# Patient Record
Sex: Female | Born: 1990 | Race: Black or African American | Hispanic: No | Marital: Single | Smoking: Former smoker
Health system: Southern US, Community
[De-identification: ages and names within clinical notes are randomized; demographics above are authoritative.]

## PROBLEM LIST (undated history)

## (undated) DIAGNOSIS — J45909 Unspecified asthma, uncomplicated: Secondary | ICD-10-CM

## (undated) DIAGNOSIS — T7840XA Allergy, unspecified, initial encounter: Secondary | ICD-10-CM

## (undated) HISTORY — DX: Allergy, unspecified, initial encounter: T78.40XA

## (undated) HISTORY — DX: Unspecified asthma, uncomplicated: J45.909

---
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
## 2009-09-27 ENCOUNTER — Emergency Department: Payer: Self-pay | Admitting: Emergency Medicine

## 2010-10-03 ENCOUNTER — Ambulatory Visit: Payer: Self-pay | Admitting: Family Medicine

## 2011-01-27 ENCOUNTER — Emergency Department (HOSPITAL_COMMUNITY)
Admission: EM | Admit: 2011-01-27 | Discharge: 2011-01-27 | Disposition: A | Discharge status: Home / Self Care | Payer: Self-pay | Attending: Emergency Medicine | Admitting: Emergency Medicine

## 2011-01-27 ENCOUNTER — Encounter (HOSPITAL_COMMUNITY): Payer: Self-pay | Admitting: Emergency Medicine

## 2011-01-27 DIAGNOSIS — J111 Influenza due to unidentified influenza virus with other respiratory manifestations: Secondary | ICD-10-CM | POA: Insufficient documentation

## 2011-01-27 DIAGNOSIS — R6889 Other general symptoms and signs: Secondary | ICD-10-CM

## 2011-01-27 MED ORDER — HYDROCODONE-HOMATROPINE 5-1.5 MG/5ML PO SYRP: 5.0000 mL | ORAL_SOLUTION | Freq: Four times a day (QID) | ORAL | Status: DC | PRN

## 2011-01-27 NOTE — ED Notes (Signed)
Pt reports three day hx of body aches, cough, nausea.Last full meal-16 hrs ago-pizza

## 2011-01-27 NOTE — ED Provider Notes (Signed)
History     CSN: [id number]  Arrival date & time 01/27/11  4098   First MD Initiated Contact with Patient 01/27/11 1002      Chief Complaint  Patient presents with  . Cough  . Nausea  . Dizziness  . Generalized Body Aches  . Facial Pain    (Consider location/radiation/quality/duration/timing/severity/associated sxs/prior treatment) HPI Comments: Pt presents to the ED with complaints of flu-like symptoms of cough, congestion, sore throat, muscle aches, chills,& fevers. The patient states that the symptoms started Thursday, 4 days ago.  Pt has been around other sick contacts and did not get the flu shot this year. The patient denies ear pain,  vomiting, diarrhea headaches, neck pain, weakness, vision changes, severe abdominal pain, inability to eat or drink, difficulty breathing, SOB, wheezing, chest pain. The patient has tried cough medicine, NSAIDS, and rest but has only felt mild relief.    Patient is a 21 y.o. female presenting with cough. The history is provided by the patient.  Cough Associated symptoms include chills and myalgias. Pertinent negatives include no chest pain, no ear pain, no rhinorrhea and no sore throat.    History reviewed. No pertinent past medical history.  History reviewed. No pertinent past surgical history.  Family History  Problem Relation Age of Onset  . Diabetes Mother   . Hypertension Mother   . Asthma Mother     History  Substance Use Topics  . Smoking status: Current Everyday Smoker  . Smokeless tobacco: Not on file  . Alcohol Use: Yes    OB History    Grav Para Term Preterm Abortions TAB SAB Ect Mult Living                  Review of Systems  Constitutional: Positive for fever, chills and fatigue.  HENT: Positive for congestion. Negative for ear pain, sore throat, rhinorrhea, sneezing, neck pain, neck stiffness, sinus pressure and tinnitus.   Eyes: Negative for visual disturbance.  Respiratory: Positive for cough and chest  tightness.   Cardiovascular: Negative for chest pain and palpitations.  Gastrointestinal: Negative for nausea, vomiting, abdominal pain and diarrhea.  Genitourinary: Negative for dysuria.  Musculoskeletal: Positive for myalgias.  Skin: Negative for color change and rash.  Neurological: Negative for dizziness and weakness.  Hematological: Does not bruise/bleed easily.  Psychiatric/Behavioral: Negative for confusion.  All other systems reviewed and are negative.    Allergies  Review of patient's allergies indicates no known allergies.  Home Medications   Current Outpatient Rx  Name Route Sig Dispense Refill  . DAYQUIL PO Oral Take 1 capsule by mouth every 8 (eight) hours as needed. Cold symptoms      BP 101/61  Pulse 64  Temp(Src) 98.6 F (37 C) (Oral)  Resp 12  SpO2 100%  LMP 01/04/2011  Physical Exam  Constitutional: She is oriented to person, place, and time. She appears well-developed and well-nourished. No distress.  HENT:  Head: Normocephalic and atraumatic. No trismus in the jaw.  Right Ear: External ear normal. No drainage or tenderness. No mastoid tenderness.  Left Ear: External ear normal. No drainage or tenderness. No mastoid tenderness.  Nose: Nose normal. No rhinorrhea or sinus tenderness.  Mouth/Throat: Uvula is midline, oropharynx is clear and moist and mucous membranes are normal. No uvula swelling. No oropharyngeal exudate.  Eyes: Conjunctivae and EOM are normal. Right eye exhibits no discharge. Left eye exhibits no discharge. No scleral icterus.  Neck: Normal range of motion. Neck supple.  Cardiovascular: Normal  rate, regular rhythm and normal heart sounds.   Pulmonary/Chest: Effort normal and breath sounds normal. No stridor. No respiratory distress. She has no wheezes. She exhibits tenderness.  Abdominal: Soft. There is no tenderness.  Musculoskeletal: Normal range of motion.  Neurological: She is alert and oriented to person, place, and time.  Skin:  Skin is warm and dry. No rash noted. She is not diaphoretic.  Psychiatric: She has a normal mood and affect. Her behavior is normal.    ED Course  Procedures (including critical care time)  Labs Reviewed - No data to display No results found.   No diagnosis found.    MDM  Flu like s/s Patient with symptoms consistent with influenza.  Vitals are stable, low-grade fever.  No signs of dehydration, tolerating PO's.  Lungs are clear. Due to patient's presentation and physical exam a chest x-ray was not ordered bc likely diagnosis of flu.  Discussed the cost versus benefit of Tamiflu treatment with the patient.  The patient understands that symptoms are greater than the recommended 24-48 hour window of treatment.  Patient will be discharged with instructions to orally hydrate, rest, and use over-the-counter medications such as anti-inflammatories ibuprofen and Aleve for muscle aches and Tylenol for fever.  Patient will also be given a cough suppressant.         Jaci Carrel, [place] 01/27/11 1040

## 2011-01-28 NOTE — ED Provider Notes (Signed)
Medical screening examination/treatment/procedure(s) were performed by non-physician practitioner and as supervising physician I was immediately available for consultation/collaboration.  Felecity Lemaster, MD 01/28/11 0718 

## 2011-02-01 ENCOUNTER — Emergency Department (HOSPITAL_COMMUNITY)
Admission: EM | Admit: 2011-02-01 | Discharge: 2011-02-01 | Disposition: A | Discharge status: Home / Self Care | Payer: BC Managed Care – PPO | Attending: Emergency Medicine | Admitting: Emergency Medicine

## 2011-02-01 ENCOUNTER — Other Ambulatory Visit: Payer: Self-pay

## 2011-02-01 ENCOUNTER — Encounter (HOSPITAL_COMMUNITY): Payer: Self-pay | Admitting: *Deleted

## 2011-02-01 DIAGNOSIS — R112 Nausea with vomiting, unspecified: Secondary | ICD-10-CM | POA: Insufficient documentation

## 2011-02-01 DIAGNOSIS — R55 Syncope and collapse: Secondary | ICD-10-CM | POA: Insufficient documentation

## 2011-02-01 DIAGNOSIS — J111 Influenza due to unidentified influenza virus with other respiratory manifestations: Secondary | ICD-10-CM | POA: Insufficient documentation

## 2011-02-01 LAB — BASIC METABOLIC PANEL
BUN: 15 mg/dL (ref 6–23)
GFR calc Af Amer: >90 mL/min (ref >90)
GFR calc non Af Amer: >90 mL/min (ref >90)
Potassium: 3.8 mEq/L (ref 3.5–5.1)
Sodium: 140 mEq/L (ref 135–145)

## 2011-02-01 LAB — DIFFERENTIAL
Basophils Relative: 0 % (ref 0–1)
Eosinophils Absolute: 0.1 10*3/uL (ref 0.0–0.7)
Lymphocytes Relative: 24 % (ref 12–46)
Monocytes Absolute: 0.3 10*3/uL (ref 0.1–1.0)
Neutro Abs: 3.6 10*3/uL (ref 1.7–7.7)

## 2011-02-01 LAB — CBC
MCHC: 34.3 g/dL (ref 30.0–36.0)
Platelets: 282 10*3/uL (ref 150–400)
RDW: 13.3 % (ref 11.5–15.5)

## 2011-02-01 MED ORDER — ONDANSETRON HCL 4 MG/2ML IJ SOLN
4.0000 mg | Freq: Once | INTRAMUSCULAR | Status: AC
  Administered 2011-02-01: 4 mg via INTRAVENOUS
  Filled 2011-02-01: qty 2

## 2011-02-01 MED ORDER — KETOROLAC TROMETHAMINE 30 MG/ML IJ SOLN
30.0000 mg | Freq: Once | INTRAMUSCULAR | Status: AC
  Administered 2011-02-01: 30 mg via INTRAVENOUS
  Filled 2011-02-01: qty 1

## 2011-02-01 MED ORDER — SODIUM CHLORIDE 0.9 % IV BOLUS (SEPSIS)
1000.0000 mL | Freq: Once | INTRAVENOUS | Status: AC
  Administered 2011-02-01: 1000 mL via INTRAVENOUS

## 2011-02-01 MED ORDER — ONDANSETRON HCL 4 MG PO TABS: 4.0000 mg | ORAL_TABLET | Freq: Three times a day (TID) | ORAL | Status: AC | PRN

## 2011-02-01 NOTE — ED Provider Notes (Addendum)
History     CSN: [id number]  Arrival date & time 02/01/11  4098   First MD Initiated Contact with Patient 02/01/11 [phone]      Chief Complaint  Patient presents with  . Generalized Body Aches  . Nausea  . Dizziness  . Headache  . Chills    hot flashes    (Consider location/radiation/quality/duration/timing/severity/associated sxs/prior treatment) HPI The patient presents 5 days after an initial presentation for this illness.  Her symptoms actually began 8 days ago.  She was diagnosed 5 days ago with influenza, clinically.  She notes that in the interim she has been anorexic, with persistent myalgias, subjective fever, cough.  She now notes new near-syncope, worsening aches, worsening nausea.  The aches are diffuse.  No relief w OTC meds or narcotics.  No exacerbating factors. History reviewed. No pertinent past medical history.  History reviewed. No pertinent past surgical history.  Family History  Problem Relation Age of Onset  . Diabetes Mother   . Hypertension Mother   . Asthma Mother     History  Substance Use Topics  . Smoking status: Current Everyday Smoker  . Smokeless tobacco: Not on file  . Alcohol Use: Yes    OB History    Grav Para Term Preterm Abortions TAB SAB Ect Mult Living                  Review of Systems  Constitutional:       HPI  HENT:       HPI otherwise negative  Eyes: Negative.   Respiratory:       HPI, otherwise negative  Cardiovascular:       HPI, otherwise nmegative  Gastrointestinal: Positive for nausea and vomiting. Negative for diarrhea.  Genitourinary:       HPI, otherwise negative  Musculoskeletal:       HPI, otherwise negative  Skin: Negative.   Neurological: Negative for syncope.    Allergies  Review of patient's allergies indicates no known allergies.  Home Medications   Current Outpatient Rx  Name Route Sig Dispense Refill  . HYDROCODONE-HOMATROPINE 5-1.5 MG/5ML PO SYRP Oral Take 5 mLs by mouth every 6 (six)  hours as needed. For cough or pain    . DAYQUIL PO Oral Take 1 capsule by mouth every 8 (eight) hours as needed. Cold symptoms      BP 122/55  Pulse 72  Temp(Src) 98.3 F (36.8 C) (Oral)  Resp 19  SpO2 100%  LMP 01/04/2011  Physical Exam  Nursing note and vitals reviewed. Constitutional: She is oriented to person, place, and time. She appears well-developed and well-nourished. No distress.  HENT:  Head: Normocephalic and atraumatic.  Eyes: Conjunctivae and EOM are normal.  Cardiovascular: Normal rate and regular rhythm.   Pulmonary/Chest: Effort normal. No accessory muscle usage or stridor. Not tachypneic. No respiratory distress. She has no decreased breath sounds. She has no wheezes. She has no rhonchi. She has no rales.  Abdominal: She exhibits no distension.  Musculoskeletal: She exhibits no edema.  Neurological: She is alert and oriented to person, place, and time. No cranial nerve deficit.  Skin: Skin is warm and dry.  Psychiatric: She has a normal mood and affect.    ED Course  Procedures (including critical care time)   Labs Reviewed  CBC  BASIC METABOLIC PANEL  DIFFERENTIAL   No results found.   No diagnosis found.    Date: 02/01/2011  Rate: 57  Rhythm: normal sinus rhythm  QRS  Axis: normal  Intervals: normal  ST/T Wave abnormalities: normal  Conduction Disutrbances:none  Narrative Interpretation:   Old EKG Reviewed: none available NORMAL  MDM  This generally well female now presents one week after her symptoms began, and several days after being clinically diagnosed with influenza.  On exam the patient is in no distress, with unremarkable vital signs.  The patient notes that she continues to tolerate by mouth, though she does have anorexia.  The absence of fevers, chills, disorientation, abnormal vital signs, remarkable labs are reassuring.  The patient is likely experience the continued effects of her flu-like illness.  She was d/c in stable condition  w anti-emetics, PMD F/U.        Gerhard Munch, MD 02/01/11 1120  Gerhard Munch, MD 02/01/11 1123

## 2011-02-01 NOTE — ED Notes (Signed)
Patient here Monday given hydrocodone. Continues with symptoms. Although she has felt dizzy she has not had any syncopal events.

## 2011-07-04 ENCOUNTER — Ambulatory Visit: Payer: BC Managed Care – PPO

## 2011-07-09 ENCOUNTER — Emergency Department (HOSPITAL_COMMUNITY): Payer: BC Managed Care – PPO

## 2011-07-09 ENCOUNTER — Encounter (HOSPITAL_COMMUNITY): Payer: Self-pay | Admitting: Emergency Medicine

## 2011-07-09 ENCOUNTER — Emergency Department (HOSPITAL_COMMUNITY)
Admission: EM | Admit: 2011-07-09 | Discharge: 2011-07-09 | Disposition: A | Discharge status: Home / Self Care | Payer: BC Managed Care – PPO | Attending: Emergency Medicine | Admitting: Emergency Medicine

## 2011-07-09 DIAGNOSIS — M7989 Other specified soft tissue disorders: Secondary | ICD-10-CM | POA: Insufficient documentation

## 2011-07-09 DIAGNOSIS — M6789 Other specified disorders of synovium and tendon, multiple sites: Secondary | ICD-10-CM

## 2011-07-09 DIAGNOSIS — M79609 Pain in unspecified limb: Secondary | ICD-10-CM | POA: Insufficient documentation

## 2011-07-09 DIAGNOSIS — W219XXA Striking against or struck by unspecified sports equipment, initial encounter: Secondary | ICD-10-CM | POA: Insufficient documentation

## 2011-07-09 DIAGNOSIS — F17200 Nicotine dependence, unspecified, uncomplicated: Secondary | ICD-10-CM | POA: Insufficient documentation

## 2011-07-09 MED ORDER — HYDROCODONE-ACETAMINOPHEN 5-325 MG PO TABS: 1.0000 | ORAL_TABLET | ORAL | Status: AC | PRN

## 2011-07-09 NOTE — ED Provider Notes (Signed)
Medical screening examination/treatment/procedure(s) were performed by non-physician practitioner and as supervising physician I was immediately available for consultation/collaboration.    Vida Roller, MD 07/09/11 [phone]

## 2011-07-09 NOTE — ED Provider Notes (Signed)
History     CSN: [id number]  Arrival date & time 07/09/11  1454   First MD Initiated Contact with Patient 07/09/11 1545      No chief complaint on file.   (Consider location/radiation/quality/duration/timing/severity/associated sxs/prior treatment) HPI Comments: Patient, who is left handed, presents today with left 5th finger pain and swelling at the PIP joint after jamming the finger while playing basketball two weeks ago - she states that she initially splinted it on her own, but kept taking the splint off to check the finger.  She states that it continues to hurt and swell - she denies numbness or tingling, she is able to fully flex the finger but is unable to completely extend it.  Patient is a 21 y.o. female presenting with hand pain. The history is provided by the patient. No language interpreter was used.  Hand Pain This is a new problem. The current episode started 1 to 4 weeks ago. The problem occurs constantly. The problem has been unchanged. Associated symptoms include arthralgias and joint swelling. Pertinent negatives include no abdominal pain, anorexia, change in bowel habit, chest pain, chills, congestion, coughing, diaphoresis, fatigue, fever, headaches, myalgias, nausea, neck pain, numbness, rash, sore throat, swollen glands, urinary symptoms, vertigo, visual change, vomiting or weakness. The symptoms are aggravated by bending. She has tried nothing for the symptoms. The treatment provided no relief.    History reviewed. No pertinent past medical history.  History reviewed. No pertinent past surgical history.  Family History  Problem Relation Age of Onset  . Diabetes Mother   . Hypertension Mother   . Asthma Mother     History  Substance Use Topics  . Smoking status: Current Everyday Smoker  . Smokeless tobacco: Not on file  . Alcohol Use: Yes    OB History    Grav Para Term Preterm Abortions TAB SAB Ect Mult Living                  Review of Systems    Constitutional: Negative for fever, chills, diaphoresis and fatigue.  HENT: Negative for congestion, sore throat and neck pain.   Respiratory: Negative for cough.   Cardiovascular: Negative for chest pain.  Gastrointestinal: Negative for nausea, vomiting, abdominal pain, anorexia and change in bowel habit.  Musculoskeletal: Positive for joint swelling and arthralgias. Negative for myalgias.  Skin: Negative for rash.  Neurological: Negative for vertigo, weakness, numbness and headaches.  All other systems reviewed and are negative.    Allergies  Review of patient's allergies indicates no known allergies.  Home Medications  No current outpatient prescriptions on file.  BP 102/61  Pulse 62  Temp 98.8 F (37.1 C) (Oral)  Resp 16  SpO2 100%  LMP 06/17/2011  Physical Exam  Nursing note and vitals reviewed. Constitutional: She is oriented to person, place, and time. She appears well-developed and well-nourished. No distress.  HENT:  Head: Normocephalic and atraumatic.  Right Ear: External ear normal.  Left Ear: External ear normal.  Nose: Nose normal.  Mouth/Throat: Oropharynx is clear and moist. No oropharyngeal exudate.  Eyes: Conjunctivae are normal. Pupils are equal, round, and reactive to light. No scleral icterus.  Neck: Normal range of motion. Neck supple.  Cardiovascular: Normal rate, regular rhythm and normal heart sounds.  Exam reveals no gallop and no friction rub.   No murmur heard. Pulmonary/Chest: Effort normal and breath sounds normal. No respiratory distress. She has no wheezes. She has no rales. She exhibits no tenderness.  Abdominal: Soft. Bowel  sounds are normal. She exhibits no distension. There is no tenderness.  Musculoskeletal:       Left hand: She exhibits decreased range of motion, tenderness, bony tenderness and swelling. She exhibits normal two-point discrimination and normal capillary refill. normal sensation noted. Normal strength noted.        Swelling noted at left 5th PIP, flexion intact, unable to fully extend the finger, sensation and capillary refill normal.  Lymphadenopathy:    She has no cervical adenopathy.  Neurological: She is alert and oriented to person, place, and time. No cranial nerve deficit. She exhibits normal muscle tone. Coordination normal.  Skin: Skin is warm and dry. No rash noted. No erythema. No pallor.  Psychiatric: She has a normal mood and affect. Her behavior is normal. Judgment and thought content normal.    ED Course  Procedures (including critical care time)  Labs Reviewed - No data to display Dg Hand Complete Left  07/09/2011  *RADIOLOGY REPORT*  Clinical Data: Blunt trauma to the left fifth finger playing basketball 2 weeks ago.  Persistent left fifth finger pain.  LEFT HAND - COMPLETE 3+ VIEW  Comparison: No priors.  Findings: Three views of the hand demonstrate no acute fracture, subluxation, dislocation or joint abnormality.  Specifically, the left fifth finger appears intact. However, there is soft tissue swelling around the PIP joint in the left fifth finger.  IMPRESSION: 1.  Negative for acute bony abnormality. 2.  However, there is persistent soft tissue swelling around the PIP joint of the left fifth finger, particularly laterally.  Original Report Authenticated By: Florencia Reasons, M.D.     Left 5th PIP extensor tendon disruption.   MDM  Patient here with pain and swelling at DIP and inability to fully straighten the finger at the PIP joint so I suspect avulsion of the extensor tendon there.  I have splinted the finger in extension and will refer to Dr. Amanda Pea with hand surgery, she has been informed not to remove the splint until she is seen by Dr. Amanda Pea.        Izola Price [place], [place] 07/09/11 1717

## 2011-07-09 NOTE — ED Notes (Addendum)
Pt reports hurting left fithh finger while playing basketball. Deformity noted and swelling.

## 2011-11-10 ENCOUNTER — Other Ambulatory Visit: Payer: Self-pay

## 2011-11-17 ENCOUNTER — Emergency Department (HOSPITAL_COMMUNITY)
Admission: EM | Admit: 2011-11-17 | Discharge: 2011-11-17 | Disposition: A | Discharge status: Home / Self Care | Payer: BC Managed Care – PPO | Attending: Emergency Medicine | Admitting: Emergency Medicine

## 2011-11-17 ENCOUNTER — Encounter (HOSPITAL_COMMUNITY): Payer: Self-pay | Admitting: *Deleted

## 2011-11-17 DIAGNOSIS — F17200 Nicotine dependence, unspecified, uncomplicated: Secondary | ICD-10-CM | POA: Insufficient documentation

## 2011-11-17 DIAGNOSIS — H16009 Unspecified corneal ulcer, unspecified eye: Secondary | ICD-10-CM | POA: Insufficient documentation

## 2011-11-17 MED ORDER — TETRACAINE HCL 0.5 % OP SOLN
OPHTHALMIC | Status: AC
  Filled 2011-11-17: qty 2

## 2011-11-17 MED ORDER — TOBRAMYCIN 0.3 % OP SOLN: 1.0000 [drp] | OPHTHALMIC | Status: DC

## 2011-11-17 NOTE — ED Notes (Signed)
Pt c/o right eye irritation x's 2 days. Pt reports eye is watery.

## 2011-11-17 NOTE — ED Provider Notes (Signed)
History     CSN: [id number]  Arrival date & time 11/17/11  1407   First MD Initiated Contact with Patient 11/17/11 1415      Chief Complaint  Patient presents with  . Eye Pain    (Consider location/radiation/quality/duration/timing/severity/associated sxs/prior treatment) HPI Comments: Patient is a 21 year old female who presents with a 2 day history of right eye pain. The patient reports a severe, "scratching" sensation in her right eye. The pain started gradually and progressively worsened since the onset. The pain is localized and does not radiate. No aggravating/alleviating factors. Patient has not tried anything for symptom relief. Patient reports associated watery discharge and photophobia. Patient wears contacts usually and has not been able to wear contacts for the past 2 days due to pain and irritation. Patient denies fever/chills, headache, congestion, sore throat, ear pain.    History reviewed. No pertinent past medical history.  History reviewed. No pertinent past surgical history.  Family History  Problem Relation Age of Onset  . Diabetes Mother   . Hypertension Mother   . Asthma Mother     History  Substance Use Topics  . Smoking status: Current Every Day Smoker    Types: Cigarettes  . Smokeless tobacco: Not on file  . Alcohol Use: Yes    OB History    Grav Para Term Preterm Abortions TAB SAB Ect Mult Living                  Review of Systems  Eyes: Positive for photophobia, pain, discharge, redness and visual disturbance.  All other systems reviewed and are negative.    Allergies  Review of patient's allergies indicates no known allergies.  Home Medications  No current outpatient prescriptions on file.  BP 117/66  Pulse 73  Temp 98.5 F (36.9 C) (Oral)  Resp 18  Ht 5\' 6"  (1.676 m)  Wt 148 lb 6.4 oz (67.314 kg)  BMI 23.95 kg/m2  SpO2 99%  LMP 10/17/2011  Physical Exam  Nursing note and vitals reviewed. Constitutional: She is oriented  to person, place, and time. She appears well-developed and well-nourished. No distress.  HENT:  Head: Normocephalic and atraumatic.  Eyes: EOM are normal. Pupils are equal, round, and reactive to light. No scleral icterus.       Watery discharge from right eye. Right sclera injected. Small (pinpoint) area of developing corneal ulceration located at the 6 oclock position around the pupil. Area of ulceration does not overlie the pupil or affected visual acuity.   Neck: Normal range of motion.  Cardiovascular: Normal rate and regular rhythm.  Exam reveals no gallop and no friction rub.   No murmur heard. Pulmonary/Chest: Effort normal and breath sounds normal. She has no wheezes. She has no rales. She exhibits no tenderness.  Abdominal: Soft. She exhibits no distension.  Musculoskeletal: Normal range of motion.  Neurological: She is alert and oriented to person, place, and time.       Speech is goal-oriented. Moves limbs without ataxia.   Skin: Skin is warm and dry.  Psychiatric: She has a normal mood and affect. Her behavior is normal.    ED Course  Procedures (including critical care time)  Labs Reviewed - No data to display No results found.   1. Corneal ulceration       MDM  3:17 PM Patient's eye exam reveals developing corneal ulceration. Patient will be discharged with Tobramycin eye drops and instructions for close follow up with her ophthalmologist. No further  evaluation needed at this time. Patient instructed to return with worsening or concerning symptoms.         Emilia Beck, PA-C 11/17/11 1533

## 2011-11-18 NOTE — ED Provider Notes (Signed)
Medical screening examination/treatment/procedure(s) were conducted as a shared visit with non-physician practitioner(s) and myself.  I personally evaluated the patient during the encounter  Appears to be early corneal ulceration. optho follow up in 24 hours. Tobramycin drops. She understands the importance of seeing the opthomologist  Lyanne Co, MD 11/18/11 1556

## 2013-05-04 ENCOUNTER — Other Ambulatory Visit: Payer: Self-pay | Admitting: Neurological Surgery

## 2013-05-12 ENCOUNTER — Encounter (HOSPITAL_COMMUNITY): Payer: Self-pay | Admitting: Pharmacy Technician

## 2013-05-12 ENCOUNTER — Inpatient Hospital Stay (HOSPITAL_COMMUNITY)
Admission: RE | Admit: 2013-05-12 | Discharge: 2013-05-12 | Disposition: A | Discharge status: Home / Self Care | Payer: BC Managed Care – PPO | Source: Ambulatory Visit

## 2013-05-12 NOTE — Pre-Procedure Instructions (Signed)
Sierra GallusBrittany Long  05/12/2013   Your procedure is scheduled on:  Monday, May 11th   [facility] Admitting at  5:30  AM.   Call this number if you have problems the morning of surgery: [phone]   Remember:   Do not eat food or drink liquids after midnight Sunday.   Take these medicines the morning of surgery with A SIP OF WATER:  Tobrex eye drops   Do not wear jewelry, make-up or nail polish.  Do not wear lotions, powders, or perfumes. You may NOT  wear deodorant.  Do not shave underarms & legs 48 hours prior to surgery.    Do not bring valuables to the hospital.  [facility] is not responsible for any belongings or valuables.               Contacts, dentures or bridgework may not be worn into surgery.  Leave suitcase in the car. After surgery it may be brought to your room.  For patients admitted to the hospital, discharge time is determined by your treatment team.               Patients discharged the day of surgery will not be allowed to drive home.   Name and phone number of your driver:    Special Instructions: "Preparing for Surgery" instruction sheet.   Please read over the following fact sheets that you were given: Pain Booklet and Surgical Site Infection Prevention

## 2013-05-13 ENCOUNTER — Encounter (HOSPITAL_COMMUNITY): Payer: Self-pay

## 2013-05-13 ENCOUNTER — Encounter (HOSPITAL_COMMUNITY)
Admission: RE | Admit: 2013-05-13 | Discharge: 2013-05-13 | Disposition: A | Discharge status: Home / Self Care | Payer: Managed Care, Other (non HMO) | Source: Ambulatory Visit | Attending: Neurological Surgery | Admitting: Neurological Surgery

## 2013-05-13 DIAGNOSIS — F17200 Nicotine dependence, unspecified, uncomplicated: Secondary | ICD-10-CM | POA: Diagnosis not present

## 2013-05-13 DIAGNOSIS — M47812 Spondylosis without myelopathy or radiculopathy, cervical region: Secondary | ICD-10-CM | POA: Diagnosis present

## 2013-05-13 DIAGNOSIS — Z01812 Encounter for preprocedural laboratory examination: Secondary | ICD-10-CM | POA: Diagnosis not present

## 2013-05-13 DIAGNOSIS — S13171A Dislocation of C6/C7 cervical vertebrae, initial encounter: Secondary | ICD-10-CM | POA: Diagnosis not present

## 2013-05-13 DIAGNOSIS — S129XXA Fracture of neck, unspecified, initial encounter: Secondary | ICD-10-CM | POA: Diagnosis not present

## 2013-05-13 LAB — CBC
HCT: 36.6 % (ref 36.0–46.0)
Hemoglobin: 12.2 g/dL (ref 12.0–15.0)
MCH: 28.9 pg (ref 26.0–34.0)
MCHC: 33.3 g/dL (ref 30.0–36.0)
MCV: 86.7 fL (ref 78.0–100.0)
PLATELETS: 268 10*3/uL (ref 150–400)
RBC: 4.22 MIL/uL (ref 3.87–5.11)
RDW: 13.2 % (ref 11.5–15.5)
WBC: 3.5 10*3/uL — ABNORMAL LOW (ref 4.0–10.5)

## 2013-05-13 LAB — HCG, SERUM, QUALITATIVE: PREG SERUM: NEGATIVE

## 2013-05-13 LAB — SURGICAL PCR SCREEN
MRSA, PCR: NEGATIVE
Staphylococcus aureus: NEGATIVE

## 2013-05-15 MED ORDER — CEFAZOLIN SODIUM-DEXTROSE 2-3 GM-% IV SOLR
2.0000 g | INTRAVENOUS | Status: AC
  Administered 2013-05-16: 2 g via INTRAVENOUS
  Filled 2013-05-15: qty 50

## 2013-05-16 ENCOUNTER — Ambulatory Visit (HOSPITAL_COMMUNITY)
Admission: RE | Admit: 2013-05-16 | Discharge: 2013-05-16 | Disposition: A | Discharge status: Home / Self Care | Payer: Managed Care, Other (non HMO) | Source: Ambulatory Visit | Attending: Neurological Surgery | Admitting: Neurological Surgery

## 2013-05-16 ENCOUNTER — Encounter (HOSPITAL_COMMUNITY): Payer: Managed Care, Other (non HMO) | Admitting: Anesthesiology

## 2013-05-16 ENCOUNTER — Encounter (HOSPITAL_COMMUNITY): Payer: Self-pay | Admitting: *Deleted

## 2013-05-16 ENCOUNTER — Ambulatory Visit (HOSPITAL_COMMUNITY): Payer: Managed Care, Other (non HMO)

## 2013-05-16 ENCOUNTER — Ambulatory Visit (HOSPITAL_COMMUNITY): Payer: Managed Care, Other (non HMO) | Admitting: Anesthesiology

## 2013-05-16 ENCOUNTER — Encounter (HOSPITAL_COMMUNITY)
Admission: RE | Disposition: A | Discharge status: Home / Self Care | Payer: Self-pay | Source: Ambulatory Visit | Attending: Neurological Surgery

## 2013-05-16 DIAGNOSIS — M50223 Other cervical disc displacement at C6-C7 level: Secondary | ICD-10-CM | POA: Diagnosis present

## 2013-05-16 DIAGNOSIS — F17200 Nicotine dependence, unspecified, uncomplicated: Secondary | ICD-10-CM | POA: Insufficient documentation

## 2013-05-16 DIAGNOSIS — S13171A Dislocation of C6/C7 cervical vertebrae, initial encounter: Secondary | ICD-10-CM | POA: Diagnosis not present

## 2013-05-16 DIAGNOSIS — Z01812 Encounter for preprocedural laboratory examination: Secondary | ICD-10-CM | POA: Insufficient documentation

## 2013-05-16 DIAGNOSIS — M47812 Spondylosis without myelopathy or radiculopathy, cervical region: Secondary | ICD-10-CM | POA: Insufficient documentation

## 2013-05-16 DIAGNOSIS — S129XXA Fracture of neck, unspecified, initial encounter: Secondary | ICD-10-CM | POA: Insufficient documentation

## 2013-05-16 HISTORY — PX: ANTERIOR CERVICAL DECOMP/DISCECTOMY FUSION: SHX1161

## 2013-05-16 SURGERY — ANTERIOR CERVICAL DECOMPRESSION/DISCECTOMY FUSION 1 LEVEL
Anesthesia: General | Site: Neck

## 2013-05-16 MED ORDER — SODIUM CHLORIDE 0.9 % IV SOLN: 250.0000 mL | INTRAVENOUS | Status: DC

## 2013-05-16 MED ORDER — LIDOCAINE HCL (CARDIAC) 20 MG/ML IV SOLN
INTRAVENOUS | Status: DC | PRN
  Administered 2013-05-16: 60 mg via INTRAVENOUS

## 2013-05-16 MED ORDER — LIDOCAINE-EPINEPHRINE 1 %-1:100000 IJ SOLN
INTRAMUSCULAR | Status: DC | PRN
  Administered 2013-05-16: 3 mL

## 2013-05-16 MED ORDER — MIDAZOLAM HCL 5 MG/5ML IJ SOLN
INTRAMUSCULAR | Status: DC | PRN
  Administered 2013-05-16: 2 mg via INTRAVENOUS

## 2013-05-16 MED ORDER — HEMOSTATIC AGENTS (NO CHARGE) OPTIME
TOPICAL | Status: DC | PRN
  Administered 2013-05-16: 1 via TOPICAL

## 2013-05-16 MED ORDER — METHOCARBAMOL 500 MG PO TABS
500.0000 mg | ORAL_TABLET | Freq: Four times a day (QID) | ORAL | Status: DC | PRN
  Administered 2013-05-16 (×2): 500 mg via ORAL
  Filled 2013-05-16: qty 1

## 2013-05-16 MED ORDER — NEOSTIGMINE METHYLSULFATE 10 MG/10ML IV SOLN
INTRAVENOUS | Status: DC | PRN
  Administered 2013-05-16: 4 mg via INTRAVENOUS

## 2013-05-16 MED ORDER — ARTIFICIAL TEARS OP OINT
TOPICAL_OINTMENT | OPHTHALMIC | Status: AC
  Filled 2013-05-16: qty 3.5

## 2013-05-16 MED ORDER — THROMBIN 5000 UNITS EX SOLR
CUTANEOUS | Status: DC | PRN
  Administered 2013-05-16 (×2): 5000 [IU] via TOPICAL

## 2013-05-16 MED ORDER — ROCURONIUM BROMIDE 50 MG/5ML IV SOLN
INTRAVENOUS | Status: AC
  Filled 2013-05-16: qty 1

## 2013-05-16 MED ORDER — SODIUM CHLORIDE 0.9 % IR SOLN
Status: DC | PRN
  Administered 2013-05-16: 08:00:00

## 2013-05-16 MED ORDER — ROCURONIUM BROMIDE 100 MG/10ML IV SOLN
INTRAVENOUS | Status: DC | PRN
  Administered 2013-05-16: 40 mg via INTRAVENOUS

## 2013-05-16 MED ORDER — LACTATED RINGERS IV SOLN
INTRAVENOUS | Status: DC | PRN
  Administered 2013-05-16 (×2): via INTRAVENOUS

## 2013-05-16 MED ORDER — ACETAMINOPHEN 325 MG PO TABS: 650.0000 mg | ORAL_TABLET | ORAL | Status: DC | PRN

## 2013-05-16 MED ORDER — HYDROMORPHONE HCL PF 1 MG/ML IJ SOLN
INTRAMUSCULAR | Status: AC
  Filled 2013-05-16: qty 1

## 2013-05-16 MED ORDER — 0.9 % SODIUM CHLORIDE (POUR BTL) OPTIME
TOPICAL | Status: DC | PRN
  Administered 2013-05-16: 1000 mL

## 2013-05-16 MED ORDER — OXYCODONE-ACETAMINOPHEN 5-325 MG PO TABS: 1.0000 | ORAL_TABLET | ORAL | Status: DC | PRN

## 2013-05-16 MED ORDER — METHOCARBAMOL 500 MG PO TABS
ORAL_TABLET | ORAL | Status: AC
  Filled 2013-05-16: qty 1

## 2013-05-16 MED ORDER — ONDANSETRON HCL 4 MG/2ML IJ SOLN: 4.0000 mg | INTRAMUSCULAR | Status: DC | PRN

## 2013-05-16 MED ORDER — NEOSTIGMINE METHYLSULFATE 10 MG/10ML IV SOLN
INTRAVENOUS | Status: AC
  Filled 2013-05-16: qty 1

## 2013-05-16 MED ORDER — OXYCODONE-ACETAMINOPHEN 5-325 MG PO TABS
1.0000 | ORAL_TABLET | ORAL | Status: DC | PRN
  Administered 2013-05-16 (×2): 2 via ORAL
  Filled 2013-05-16: qty 2

## 2013-05-16 MED ORDER — HYDROMORPHONE HCL PF 1 MG/ML IJ SOLN
0.2500 mg | INTRAMUSCULAR | Status: DC | PRN
  Administered 2013-05-16 (×2): 0.5 mg via INTRAVENOUS

## 2013-05-16 MED ORDER — SODIUM CHLORIDE 0.9 % IJ SOLN: 3.0000 mL | INTRAMUSCULAR | Status: DC | PRN

## 2013-05-16 MED ORDER — PROPOFOL 10 MG/ML IV BOLUS
INTRAVENOUS | Status: AC
  Filled 2013-05-16: qty 20

## 2013-05-16 MED ORDER — ACETAMINOPHEN 650 MG RE SUPP: 650.0000 mg | RECTAL | Status: DC | PRN

## 2013-05-16 MED ORDER — SODIUM CHLORIDE 0.9 % IJ SOLN: 3.0000 mL | Freq: Two times a day (BID) | INTRAMUSCULAR | Status: DC

## 2013-05-16 MED ORDER — GLYCOPYRROLATE 0.2 MG/ML IJ SOLN
INTRAMUSCULAR | Status: AC
  Filled 2013-05-16: qty 3

## 2013-05-16 MED ORDER — ALUM & MAG HYDROXIDE-SIMETH 200-200-20 MG/5ML PO SUSP: 30.0000 mL | Freq: Four times a day (QID) | ORAL | Status: DC | PRN

## 2013-05-16 MED ORDER — FENTANYL CITRATE 0.05 MG/ML IJ SOLN
INTRAMUSCULAR | Status: DC | PRN
  Administered 2013-05-16: 100 ug via INTRAVENOUS
  Administered 2013-05-16 (×2): 50 ug via INTRAVENOUS

## 2013-05-16 MED ORDER — PHENOL 1.4 % MT LIQD: 1.0000 | OROMUCOSAL | Status: DC | PRN

## 2013-05-16 MED ORDER — PROPOFOL 10 MG/ML IV BOLUS
INTRAVENOUS | Status: DC | PRN
  Administered 2013-05-16: 160 mg via INTRAVENOUS

## 2013-05-16 MED ORDER — FENTANYL CITRATE 0.05 MG/ML IJ SOLN
INTRAMUSCULAR | Status: AC
  Filled 2013-05-16: qty 5

## 2013-05-16 MED ORDER — OXYCODONE-ACETAMINOPHEN 5-325 MG PO TABS
ORAL_TABLET | ORAL | Status: AC
  Filled 2013-05-16: qty 2

## 2013-05-16 MED ORDER — MORPHINE SULFATE 2 MG/ML IJ SOLN: 1.0000 mg | INTRAMUSCULAR | Status: DC | PRN

## 2013-05-16 MED ORDER — ONDANSETRON HCL 4 MG/2ML IJ SOLN
INTRAMUSCULAR | Status: AC
  Filled 2013-05-16: qty 2

## 2013-05-16 MED ORDER — ONDANSETRON HCL 4 MG/2ML IJ SOLN
INTRAMUSCULAR | Status: DC | PRN
  Administered 2013-05-16: 4 mg via INTRAVENOUS

## 2013-05-16 MED ORDER — MENTHOL 3 MG MT LOZG
1.0000 | LOZENGE | OROMUCOSAL | Status: DC | PRN
Start: 2013-05-16 — End: 2013-05-16

## 2013-05-16 MED ORDER — GLYCOPYRROLATE 0.2 MG/ML IJ SOLN
INTRAMUSCULAR | Status: DC | PRN
  Administered 2013-05-16: 0.6 mg via INTRAVENOUS

## 2013-05-16 MED ORDER — ARTIFICIAL TEARS OP OINT
TOPICAL_OINTMENT | OPHTHALMIC | Status: DC | PRN
  Administered 2013-05-16: 1 via OPHTHALMIC

## 2013-05-16 MED ORDER — CYCLOBENZAPRINE HCL 10 MG PO TABS: 10.0000 mg | ORAL_TABLET | Freq: Three times a day (TID) | ORAL | Status: DC | PRN

## 2013-05-16 MED ORDER — MIDAZOLAM HCL 2 MG/2ML IJ SOLN
INTRAMUSCULAR | Status: AC
  Filled 2013-05-16: qty 2

## 2013-05-16 MED ORDER — LIDOCAINE HCL (CARDIAC) 20 MG/ML IV SOLN
INTRAVENOUS | Status: AC
  Filled 2013-05-16: qty 5

## 2013-05-16 MED ORDER — BUPIVACAINE HCL (PF) 0.25 % IJ SOLN
INTRAMUSCULAR | Status: DC | PRN
  Administered 2013-05-16: 3 mL

## 2013-05-16 MED ORDER — METHOCARBAMOL 1000 MG/10ML IJ SOLN
500.0000 mg | Freq: Four times a day (QID) | INTRAMUSCULAR | Status: DC | PRN
  Filled 2013-05-16: qty 5

## 2013-05-16 SURGICAL SUPPLY — 66 items
ADH SKN CLS APL DERMABOND .7 (GAUZE/BANDAGES/DRESSINGS)
ADH SKN CLS LQ APL DERMABOND (GAUZE/BANDAGES/DRESSINGS) ×1
ALLOGRAFT LORDOTIC CC 7X11X14 (Bone Implant) ×2 IMPLANT
BAG DECANTER FOR FLEXI CONT (MISCELLANEOUS) ×3 IMPLANT
BANDAGE GAUZE ELAST BULKY 4 IN (GAUZE/BANDAGES/DRESSINGS) IMPLANT
BIT DRILL NEURO 2X3.1 SFT TUCH (MISCELLANEOUS) ×1 IMPLANT
BLADE 10 SAFETY STRL DISP (BLADE) ×3 IMPLANT
BUR BARREL STRAIGHT FLUTE 4.0 (BURR) ×3 IMPLANT
CANISTER SUCT 3000ML (MISCELLANEOUS) ×3 IMPLANT
CONT SPEC 4OZ CLIKSEAL STRL BL (MISCELLANEOUS) ×6 IMPLANT
DECANTER SPIKE VIAL GLASS SM (MISCELLANEOUS) ×3 IMPLANT
DERMABOND ADHESIVE PROPEN (GAUZE/BANDAGES/DRESSINGS) ×2
DERMABOND ADVANCED (GAUZE/BANDAGES/DRESSINGS)
DERMABOND ADVANCED .7 DNX12 (GAUZE/BANDAGES/DRESSINGS) ×1 IMPLANT
DERMABOND ADVANCED .7 DNX6 (GAUZE/BANDAGES/DRESSINGS) IMPLANT
DRAPE LAPAROTOMY 100X72 PEDS (DRAPES) ×3 IMPLANT
DRAPE MICROSCOPE LEICA (MISCELLANEOUS) IMPLANT
DRAPE POUCH INSTRU U-SHP 10X18 (DRAPES) ×3 IMPLANT
DRESSING TELFA 8X3 (GAUZE/BANDAGES/DRESSINGS) ×3 IMPLANT
DRILL BIT HELIX (BIT) ×2 IMPLANT
DRILL NEURO 2X3.1 SOFT TOUCH (MISCELLANEOUS) ×3
DRSG OPSITE 4X5.5 SM (GAUZE/BANDAGES/DRESSINGS) ×3 IMPLANT
DURAPREP 6ML APPLICATOR 50/CS (WOUND CARE) ×3 IMPLANT
ELECT REM PT RETURN 9FT ADLT (ELECTROSURGICAL) ×3
ELECTRODE REM PT RTRN 9FT ADLT (ELECTROSURGICAL) ×1 IMPLANT
GAUZE SPONGE 4X4 16PLY XRAY LF (GAUZE/BANDAGES/DRESSINGS) IMPLANT
GLOVE BIO SURGEON STRL SZ7.5 (GLOVE) IMPLANT
GLOVE BIOGEL PI IND STRL 7.5 (GLOVE) IMPLANT
GLOVE BIOGEL PI IND STRL 8 (GLOVE) IMPLANT
GLOVE BIOGEL PI IND STRL 8.5 (GLOVE) ×1 IMPLANT
GLOVE BIOGEL PI INDICATOR 7.5 (GLOVE) ×2
GLOVE BIOGEL PI INDICATOR 8 (GLOVE) ×4
GLOVE BIOGEL PI INDICATOR 8.5 (GLOVE) ×2
GLOVE ECLIPSE 7.5 STRL STRAW (GLOVE) ×4 IMPLANT
GLOVE ECLIPSE 8.5 STRL (GLOVE) ×3 IMPLANT
GLOVE EXAM NITRILE LRG STRL (GLOVE) IMPLANT
GLOVE EXAM NITRILE MD LF STRL (GLOVE) IMPLANT
GLOVE EXAM NITRILE XL STR (GLOVE) IMPLANT
GLOVE EXAM NITRILE XS STR PU (GLOVE) IMPLANT
GLOVE SURG SS PI 7.0 STRL IVOR (GLOVE) ×4 IMPLANT
GOWN BRE IMP SLV AUR LG STRL (GOWN DISPOSABLE) IMPLANT
GOWN BRE IMP SLV AUR XL STRL (GOWN DISPOSABLE) ×3 IMPLANT
GOWN STRL REIN 2XL LVL4 (GOWN DISPOSABLE) ×1 IMPLANT
GOWN STRL REUS W/ TWL XL LVL3 (GOWN DISPOSABLE) IMPLANT
GOWN STRL REUS W/TWL 2XL LVL3 (GOWN DISPOSABLE) ×2 IMPLANT
GOWN STRL REUS W/TWL XL LVL3 (GOWN DISPOSABLE) ×6
HALTER HD/CHIN CERV TRACTION D (MISCELLANEOUS) ×3 IMPLANT
KIT BASIN OR (CUSTOM PROCEDURE TRAY) ×3 IMPLANT
KIT ROOM TURNOVER OR (KITS) ×3 IMPLANT
NDL SPNL 22GX3.5 QUINCKE BK (NEEDLE) ×1 IMPLANT
NEEDLE HYPO 22GX1.5 SAFETY (NEEDLE) ×3 IMPLANT
NEEDLE SPNL 22GX3.5 QUINCKE BK (NEEDLE) ×3 IMPLANT
NS IRRIG 1000ML POUR BTL (IV SOLUTION) ×3 IMPLANT
PACK LAMINECTOMY NEURO (CUSTOM PROCEDURE TRAY) ×3 IMPLANT
PAD ARMBOARD 7.5X6 YLW CONV (MISCELLANEOUS) ×9 IMPLANT
PLATE HELIX R 22MM (Plate) ×2 IMPLANT
RUBBERBAND STERILE (MISCELLANEOUS) IMPLANT
SCREW 4.0X13 (Screw) IMPLANT
SCREW 4.0X13MM (Screw) ×8 IMPLANT
SPONGE INTESTINAL PEANUT (DISPOSABLE) ×3 IMPLANT
SPONGE SURGIFOAM ABS GEL SZ50 (HEMOSTASIS) ×3 IMPLANT
SUT VIC AB 3-0 SH 8-18 (SUTURE) ×5 IMPLANT
SYR 20ML ECCENTRIC (SYRINGE) ×3 IMPLANT
TOWEL OR 17X24 6PK STRL BLUE (TOWEL DISPOSABLE) ×3 IMPLANT
TOWEL OR 17X26 10 PK STRL BLUE (TOWEL DISPOSABLE) ×3 IMPLANT
WATER STERILE IRR 1000ML POUR (IV SOLUTION) ×3 IMPLANT
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
## 2013-05-16 NOTE — Anesthesia Postprocedure Evaluation (Signed)
  Anesthesia Post-op Note  Patient: Sierra Long  Procedure(s) Performed: Procedure(s) with comments: Cervical six-seven Anterior cervical decompression/diskectomy/fusion (N/A) - Cervical six-seven Anterior cervical decompression/diskectomy/fusion  Patient Location: PACU  Anesthesia Type:General  Level of Consciousness: awake  Airway and Oxygen Therapy: Patient Spontanous Breathing  Post-op Pain: mild  Post-op Assessment: Post-op Vital signs reviewed  Post-op Vital Signs: Reviewed  Last Vitals:  Filed Vitals:   05/16/13 0934  BP:   Pulse: 78  Temp:   Resp: 23    Complications: No apparent anesthesia complications

## 2013-05-16 NOTE — Evaluation (Addendum)
Occupational Therapy Evaluation Patient Details Name: Sierra Long MRN: [id number] DOB: [birth year] Today's Date: 05/16/2013    History of Present Illness 23 y.o. s/p Cervical six-seven Anterior cervical decompression/diskectomy/fusion (N/A) - Cervical six-seven Anterior cervical decompression/diskectomy/fusion   Clinical Impression   Pt moving well during session. Education provided and feel pt is safe to d/c home with assistance available at home.    Follow Up Recommendations  No OT follow up;Supervision - Intermittent    Equipment Recommendations  None recommended by OT    Recommendations for Other Services       Precautions / Restrictions Precautions Precautions: Cervical (no extreme movements) Restrictions Weight Bearing Restrictions: No      Mobility Bed Mobility Overal bed mobility: Needs Assistance Bed Mobility: Rolling;Sidelying to Sit;Sit to Sidelying Rolling: Supervision Sidelying to sit: Supervision     Sit to sidelying: Supervision General bed mobility comments: cues for log roll technique  Transfers Overall transfer level: Modified independent Equipment used: None                  Balance                                            ADL Overall ADL's : Needs assistance/impaired                     Lower Body Dressing: Supervision/safety;Sit to/from stand               Functional mobility during ADLs: Supervision/safety General ADL Comments: Educated on use of cup/straw. Told pt to slow down with ambulation-she felt a little off balance (prob due to meds). Educated on technique for bathing/dressing by crossing legs. Recommended significant other be with her for shower transfer. Educated on precautions.     Vision                     Perception     Praxis      Pertinent Vitals/Pain Pain 7/10 and sore throat. Appeared comfortable at end of session. Increased activity during session.       Hand Dominance     Extremity/Trunk Assessment Upper Extremity Assessment Upper Extremity Assessment: Overall WFL for tasks assessed;RUE deficits/detail;LUE deficits/detail RUE Sensation: decreased light touch   Lower Extremity Assessment Lower Extremity Assessment: Overall WFL for tasks assessed       Communication Communication Communication: No difficulties   Cognition Arousal/Alertness: Awake/alert Behavior During Therapy: WFL for tasks assessed/performed Overall Cognitive Status: Within Functional Limits for tasks assessed                     General Comments       Exercises       Shoulder Instructions      Home Living Family/patient expects to be discharged to:: Private residence Living Arrangements: Spouse/significant other Available Help at Discharge: Friend(s);Available 24 hours/day Type of Home: Apartment Home Access: Level entry     Home Layout: One level     Bathroom Shower/Tub: Producer, television/film/videoWalk-in shower   Bathroom Toilet: Standard     Home Equipment: Grab bars - toilet          Prior Functioning/Environment Level of Independence: Independent             OT Diagnosis:     OT Problem List:     OT Treatment/Interventions:  OT Goals(Current goals can be found in the care plan section)    OT Frequency:     Barriers to D/C:            Co-evaluation              End of Session Nurse Communication: Mobility status  Activity Tolerance: Patient tolerated treatment well Patient left: in bed;with family/visitor present   Time: [id number] OT Time Calculation (min): 10 min Charges:  OT General Charges $OT Visit: 1 Procedure OT Evaluation $Initial OT Evaluation Tier I: 1 Procedure G-Codes: OT G-codes **NOT FOR INPATIENT CLASS** Functional Assessment Tool Used: clinical judgment Functional Limitation: Self care Self Care Current Status ([id number](G8987): At least 1 percent but less than 20 percent impaired, limited or  restricted Self Care Goal Status ([id number](G8988): At least 1 percent but less than 20 percent impaired, limited or restricted Self Care Discharge Status [phone]): At least 1 percent but less than 20 percent impaired, limited or restricted  Earlie RavelingLindsey L Shammara Jarrett OTR/L [phone] 05/16/2013, 4:53 PM

## 2013-05-16 NOTE — Anesthesia Preprocedure Evaluation (Addendum)
Anesthesia Evaluation  Patient identified by MRN, date of birth, ID band Patient awake    Reviewed: Allergy & Precautions, H&P , NPO status , Patient's Chart, lab work & pertinent test results, reviewed documented beta blocker date and time   Airway Mallampati: I TM Distance: >3 FB Neck ROM: Full    Dental  (+) Teeth Intact, Dental Advisory Given, Chipped,    Pulmonary Current Smoker,  breath sounds clear to auscultation        Cardiovascular negative cardio ROS  Rhythm:Regular Rate:Normal     Neuro/Psych    GI/Hepatic negative GI ROS, Neg liver ROS,   Endo/Other  negative endocrine ROS  Renal/GU negative Renal ROS     Musculoskeletal   Abdominal   Peds  Hematology   Anesthesia Other Findings   Reproductive/Obstetrics                         Anesthesia Physical Anesthesia Plan  ASA: II  Anesthesia Plan: General   Post-op Pain Management:    Induction: Intravenous  Airway Management Planned: Oral ETT  Additional Equipment:   Intra-op Plan:   Post-operative Plan: Extubation in OR  Informed Consent: I have reviewed the patients History and Physical, chart, labs and discussed the procedure including the risks, benefits and alternatives for the proposed anesthesia with the patient or authorized representative who has indicated his/her understanding and acceptance.   Dental advisory given  Plan Discussed with: CRNA, Anesthesiologist and Surgeon  Anesthesia Plan Comments:        Anesthesia Quick Evaluation

## 2013-05-16 NOTE — Plan of Care (Signed)
Problem: Consults Goal: Diagnosis - Spinal Surgery Outcome: Not Applicable Date Met:  67/70/34 Cervical Spine Fusion

## 2013-05-16 NOTE — Discharge Summary (Signed)
Physician Discharge Summary  Patient ID: Sierra GallusBrittany Balbi MRN: [id number] DOB/AGE: 24/22/1992 23 y.o.  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 She tolerated surgery well  Consults: None  Significant Diagnostic Studies: None  Treatments: surgery: Anterior cervical decompression C6-C7 arthrodesis with structural allograft anterior plate fixation C6  Discharge Exam: Blood pressure 111/65, pulse 96, temperature 98 F (36.7 C), resp. rate 20, SpO2 99.00%. Vision is clean and dry motor function is intact in both upper extremities including the deltoids biceps triceps grips and intrinsics.  Disposition: 01-Home or Self Care  Discharge Orders   Future Orders Complete By Expires   Call MD for:  redness, tenderness, or signs of infection (pain, swelling, redness, odor or green/yellow discharge around incision site)  As directed    Call MD for:  severe uncontrolled pain  As directed    Call MD for:  temperature >100.4  As directed    Diet - low sodium heart healthy  As directed    Discharge instructions  As directed    Increase activity slowly  As directed        Medication List         acetaminophen 325 MG tablet  Commonly known as:  TYLENOL  Take 650 mg by mouth every 4 (four) hours as needed for moderate pain.     cyclobenzaprine 10 MG tablet  Commonly known as:  FLEXERIL  Take 1 tablet (10 mg total) by mouth 3 (three) times daily as needed for muscle spasms.     oxyCODONE-acetaminophen 5-325 MG per tablet  Commonly known as:  PERCOCET/ROXICET  Take 1-2 tablets by mouth every 4 (four) hours as needed for moderate pain.           Follow-up Information   Follow up with Stefani DamaELSNER,Connery Shiffler J, MD.   Specialty:  Neurosurgery   Contact information:   [address] [place] [address] [phone]       Signed: Barnett AbuHenry Meris Reede 05/16/2013, 7:50 PM

## 2013-05-16 NOTE — Progress Notes (Signed)
Notified Dr. Crews of Ivin BootyHr 47, BP 99/46, patient asymptomatic. (Dr. Ivin Bootyrews felt patient was okay).

## 2013-05-16 NOTE — Discharge Instructions (Signed)
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
## 2013-05-16 NOTE — Transfer of Care (Signed)
Immediate Anesthesia Transfer of Care Note  Patient: Sierra Long  Procedure(s) Performed: Procedure(s) with comments: Cervical six-seven Anterior cervical decompression/diskectomy/fusion (N/A) - Cervical six-seven Anterior cervical decompression/diskectomy/fusion  Patient Location: PACU  Anesthesia Type:General  Level of Consciousness: awake, alert  and oriented  Airway & Oxygen Therapy: Patient Spontanous Breathing and Patient connected to nasal cannula oxygen  Post-op Assessment: Report given to PACU RN, Post -op Vital signs reviewed and stable and Patient moving all extremities X 4  Post vital signs: Reviewed and stable  Complications: No apparent anesthesia complications

## 2013-05-16 NOTE — Anesthesia Procedure Notes (Signed)
Procedure Name: Intubation Date/Time: 05/16/2013 7:49 AM Performed by: Lanell MatarBAKER, Jonia Oakey M Pre-anesthesia Checklist: Patient identified, Timeout performed, Emergency Drugs available, Suction available and Patient being monitored Patient Re-evaluated:Patient Re-evaluated prior to inductionOxygen Delivery Method: Circle system utilized Preoxygenation: Pre-oxygenation with 100% oxygen Intubation Type: IV induction Ventilation: Mask ventilation without difficulty Laryngoscope Size: Miller and 2 Grade View: Grade II Tube type: Oral Tube size: 7.0 mm Number of attempts: 1 Airway Equipment and Method: Stylet Placement Confirmation: ETT inserted through vocal cords under direct vision,  breath sounds checked- equal and bilateral,  positive ETCO2 and CO2 detector Secured at: 22 cm Tube secured with: Tape Dental Injury: Teeth and Oropharynx as per pre-operative assessment

## 2013-05-17 ENCOUNTER — Encounter (HOSPITAL_COMMUNITY): Payer: Self-pay | Admitting: Neurological Surgery

## 2013-05-17 NOTE — Progress Notes (Signed)
Pt. Alert and oriented, follows simple instructions, denies pain. Incision area without swelling, redness or S/S of infection. Voiding adequate clear yellow urine. Moving all extremities well and vitals stable and documented. Patient discharged home with family. Anterior Cervical Fusion surgery notes instructions given to patient and family member for home safety and precautions. Pt. and family stated understanding of instructions given.  

## 2013-07-01 NOTE — OR Nursing (Signed)
Addendum to scope page 

## 2013-08-16 ENCOUNTER — Inpatient Hospital Stay (HOSPITAL_COMMUNITY)
Admission: AD | Admit: 2013-08-16 | Discharge: 2013-08-16 | Discharge status: Against Medical Advice | Payer: Managed Care, Other (non HMO) | Source: Ambulatory Visit | Attending: Obstetrics & Gynecology | Admitting: Obstetrics & Gynecology

## 2013-08-16 NOTE — MAU Note (Signed)
Pt not in lobby, second call.

## 2013-08-16 NOTE — MAU Note (Signed)
Pt called, not in lobby.  Pt did not inform receiptionist or nursing staff that she was leaving.

## 2013-08-16 NOTE — MAU Note (Signed)
Pt called, not in lobby 

## 2014-05-31 ENCOUNTER — Ambulatory Visit (INDEPENDENT_AMBULATORY_CARE_PROVIDER_SITE_OTHER): Payer: Managed Care, Other (non HMO) | Admitting: Physician Assistant

## 2014-05-31 VITALS — BP 94/62 | HR 52 | Temp 98.4°F | Resp 16 | Ht 66.5 in | Wt 135.0 lb

## 2014-05-31 DIAGNOSIS — M542 Cervicalgia: Secondary | ICD-10-CM

## 2014-05-31 DIAGNOSIS — G5680 Other specified mononeuropathies of unspecified upper limb: Secondary | ICD-10-CM

## 2014-05-31 MED ORDER — CYCLOBENZAPRINE HCL 10 MG PO TABS: 10.0000 mg | ORAL_TABLET | Freq: Three times a day (TID) | ORAL | Status: DC | PRN

## 2014-05-31 MED ORDER — MELOXICAM 15 MG PO TABS: 15.0000 mg | ORAL_TABLET | Freq: Every day | ORAL | Status: DC

## 2014-05-31 NOTE — Patient Instructions (Signed)
Cut flexeril in half and take three times a day for muscle spasm. At night you can take a full tab to help with sleep. Take mobic once a day in the mornings. Do not take with any other NSAIDs - no ibuprofen, advil, motrin, aleve. You may take with tylenol. You will get a phone call to schedule appt with physical therapist. Try to make your work station "ergonomically correct". Practice good posture at work. Return if your symptoms are not improving in 10-14 days.
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
## 2014-06-09 ENCOUNTER — Telehealth: Payer: Self-pay

## 2014-06-09 NOTE — Telephone Encounter (Addendum)
Sierra Long from [facility] states she has been trying to contact this patient for the past 4 days and the number is always busy. She will try the mother's number listed on the DPR, but she just wanted us to know that they have been trying to contact this patient. Patient was referred by Lanier ClamNicole Bush, PA-C. Sierra Long also needed the physician name from that day since the requirements would rather have the MD listed. MD listed on the office notes was Dr. Alwyn RenHopper. This is just an [place]. Martha's contact number is [phone].

## 2014-06-14 ENCOUNTER — Emergency Department (HOSPITAL_COMMUNITY)
Admission: EM | Admit: 2014-06-14 | Discharge: 2014-06-14 | Disposition: A | Discharge status: Home / Self Care | Payer: Managed Care, Other (non HMO) | Attending: Emergency Medicine | Admitting: Emergency Medicine

## 2014-06-14 ENCOUNTER — Encounter (HOSPITAL_COMMUNITY): Payer: Self-pay

## 2014-06-14 ENCOUNTER — Emergency Department (HOSPITAL_COMMUNITY): Payer: Managed Care, Other (non HMO)

## 2014-06-14 DIAGNOSIS — M62838 Other muscle spasm: Secondary | ICD-10-CM | POA: Insufficient documentation

## 2014-06-14 DIAGNOSIS — G5680 Other specified mononeuropathies of unspecified upper limb: Secondary | ICD-10-CM

## 2014-06-14 DIAGNOSIS — Z791 Long term (current) use of non-steroidal anti-inflammatories (NSAID): Secondary | ICD-10-CM | POA: Diagnosis not present

## 2014-06-14 DIAGNOSIS — M542 Cervicalgia: Secondary | ICD-10-CM | POA: Diagnosis present

## 2014-06-14 DIAGNOSIS — Z79899 Other long term (current) drug therapy: Secondary | ICD-10-CM | POA: Insufficient documentation

## 2014-06-14 DIAGNOSIS — J45909 Unspecified asthma, uncomplicated: Secondary | ICD-10-CM | POA: Diagnosis not present

## 2014-06-14 DIAGNOSIS — Z720 Tobacco use: Secondary | ICD-10-CM | POA: Diagnosis not present

## 2014-06-14 MED ORDER — CYCLOBENZAPRINE HCL 10 MG PO TABS: 10.0000 mg | ORAL_TABLET | Freq: Three times a day (TID) | ORAL | Status: DC | PRN

## 2014-06-14 NOTE — ED Notes (Signed)
Patient transported to X-ray 

## 2014-06-14 NOTE — ED Provider Notes (Signed)
CSN: [id number]     Arrival date & time 06/14/14  1131 History   First MD Initiated Contact with Patient 06/14/14 1142     Chief Complaint  Patient presents with  . Torticollis     (Consider location/radiation/quality/duration/timing/severity/associated sxs/prior Treatment) HPI Comments: Patient presents to the ED with a chief complaint of neck pain.  States that she might have slept on her neck funny, but reports PMH of herniated disc and prior neck surgery one year ago.  She states that she wants to be certain that "the bones and everything look good with her prior surgery."  She denies any numbness, tingling, weakness, or ataxia.  She has not tried taking anything to alleviate the symptoms.  The history is provided by the patient. No language interpreter was used.    Past Medical History  Diagnosis Date  . Allergy   . Asthma    Past Surgical History  Procedure Laterality Date  . Anterior cervical decomp/discectomy fusion N/A 05/16/2013    Procedure: Cervical six-seven Anterior cervical decompression/diskectomy/fusion;  Surgeon: Barnett AbuHenry Elsner, MD;  Location: [facility];  Service: Neurosurgery;  Laterality: N/A;  Cervical six-seven Anterior cervical decompression/diskectomy/fusion   Family History  Problem Relation Age of Onset  . Diabetes Mother   . Hypertension Mother   . Asthma Mother    History  Substance Use Topics  . Smoking status: Current Every Day Smoker -- 0.50 packs/day for 8 years    Types: Cigarettes  . Smokeless tobacco: Not on file  . Alcohol Use: 1.8 oz/week    3 Shots of liquor per week   OB History    No data available     Review of Systems  Constitutional: Negative for fever and chills.  Gastrointestinal:       No bowel incontinence  Genitourinary:       No urinary incontinence  Musculoskeletal: Positive for myalgias, back pain and arthralgias.  Neurological:       No saddle anesthesia      Allergies  Review of patient's allergies indicates  no known allergies.  Home Medications   Prior to Admission medications   Medication Sig Start Date End Date Taking? Authorizing Provider  cyclobenzaprine (FLEXERIL) 10 MG tablet Take 1 tablet (10 mg total) by mouth 3 (three) times daily as needed for muscle spasms. 05/31/14  Yes Lanier ClamNicole Bush V, PA-C  meloxicam (MOBIC) 15 MG tablet Take 1 tablet (15 mg total) by mouth daily. 05/31/14  Yes Lanier ClamNicole Bush V, PA-C   BP 98/63 mmHg  Pulse 54  Temp(Src) 98 F (36.7 C) (Oral)  Resp 18  SpO2 100%  LMP 05/17/2014 Physical Exam  Constitutional: She is oriented to person, place, and time. She appears well-developed and well-nourished. No distress.  HENT:  Head: Normocephalic and atraumatic.  Eyes: Conjunctivae and EOM are normal. Right eye exhibits no discharge. Left eye exhibits no discharge. No scleral icterus.  Neck: Normal range of motion. Neck supple. No tracheal deviation present.  Cardiovascular: Normal rate, regular rhythm and normal heart sounds.  Exam reveals no gallop and no friction rub.   No murmur heard. Pulmonary/Chest: Effort normal and breath sounds normal. No respiratory distress. She has no wheezes.  Abdominal: Soft. She exhibits no distension. There is no tenderness.  Musculoskeletal: Normal range of motion.  Cervical paraspinal muscles tender to palpation, no bony tenderness, step-offs, or gross abnormality or deformity of spine, patient is able to ambulate, moves all extremities    Neurological: She is alert and oriented  to person, place, and time.  Sensation and strength intact bilaterally   Skin: Skin is warm. She is not diaphoretic.  Psychiatric: She has a normal mood and affect. Her behavior is normal. Judgment and thought content normal.  Nursing note and vitals reviewed.   ED Course  Procedures (including critical care time) Labs Review Labs Reviewed - No data to display  Imaging Review Dg Cervical Spine Complete  06/14/2014   CLINICAL DATA:  24 year old female  with new left side neck pain and stiffness since last night. Painful range of motion when turning the head. Previous surgery. Initial encounter.  EXAM: CERVICAL SPINE  4+ VIEWS  COMPARISON:  [facility] cervical spine radiographs 06/08/2013 and earlier.  FINDINGS: Sequelae of C6-C7 ACDF again noted. Hardware appears intact. Improved and probably solid interbody arthrodesis (image 3). Stable cervical vertebral height and alignment elsewhere. Stable disc spaces elsewhere. Cervicothoracic junction alignment is within normal limits. Normal prevertebral soft tissue contour. Bilateral posterior element alignment is within normal limits. AP alignment and lung apices within normal limits. Normal C1-C2 alignment and odontoid.  IMPRESSION: 1. C6-C7 ACDF with no adverse features. 2.  No acute osseous abnormality identified in the cervical spine.   Electronically Signed   By: Odessa Fleming M.D.   On: 06/14/2014 12:13     EKG Interpretation None      MDM   Final diagnoses:  Neck pain  Muscle spasm    Patient with neck spasm, no weakness, no deficits.  Given prior history, will check plain films, doubt any abnormality.  Plan for treatment with muscle relaxer.  Plain films are negative.  Full ROM, but with associated muscle tightness.  DC to home with muscle relaxer.    Roxy Horseman, PA-C 06/14/14 1352  Tilden Fossa, MD 06/14/14 [id number]

## 2014-06-14 NOTE — Discharge Instructions (Signed)

## 2014-06-14 NOTE — ED Notes (Signed)
Pt presents with c/o stiff neck that started last night. Pt reports she is having a hard time moving her neck to the left. Pt reports she had neck surgery approx one year ago. Ambulatory to triage.

## 2014-07-13 NOTE — Telephone Encounter (Signed)
Patient was seen in the emergency room since this call. I do not know how she is to be contacted.

## 2014-07-26 ENCOUNTER — Inpatient Hospital Stay (HOSPITAL_COMMUNITY)
Admission: AD | Admit: 2014-07-26 | Discharge: 2014-07-26 | Disposition: A | Discharge status: Home / Self Care | Payer: Managed Care, Other (non HMO) | Source: Ambulatory Visit | Attending: Obstetrics and Gynecology | Admitting: Obstetrics and Gynecology

## 2014-07-26 ENCOUNTER — Encounter (HOSPITAL_COMMUNITY): Payer: Self-pay | Admitting: *Deleted

## 2014-07-26 DIAGNOSIS — A5901 Trichomonal vulvovaginitis: Secondary | ICD-10-CM

## 2014-07-26 DIAGNOSIS — B373 Candidiasis of vulva and vagina: Secondary | ICD-10-CM

## 2014-07-26 DIAGNOSIS — B3731 Acute candidiasis of vulva and vagina: Secondary | ICD-10-CM

## 2014-07-26 LAB — URINALYSIS, ROUTINE W REFLEX MICROSCOPIC
Bilirubin Urine: NEGATIVE
GLUCOSE, UA: NEGATIVE mg/dL
HGB URINE DIPSTICK: NEGATIVE
Ketones, ur: NEGATIVE mg/dL
Leukocytes, UA: NEGATIVE
NITRITE: NEGATIVE
PH: 6 (ref 5.0–8.0)
Protein, ur: NEGATIVE mg/dL
Specific Gravity, Urine: >1.03 — ABNORMAL HIGH (ref 1.005–1.030)
Urobilinogen, UA: 0.2 mg/dL (ref 0.0–1.0)

## 2014-07-26 LAB — POCT PREGNANCY, URINE
PREG TEST UR: NEGATIVE
Preg Test, Ur: NEGATIVE

## 2014-07-26 LAB — WET PREP, GENITAL: CLUE CELLS WET PREP: NONE SEEN

## 2014-07-26 MED ORDER — FLUCONAZOLE 150 MG PO TABS: 150.0000 mg | ORAL_TABLET | Freq: Once | ORAL | Status: DC

## 2014-07-26 MED ORDER — METRONIDAZOLE 500 MG PO TABS
500.0000 mg | ORAL_TABLET | Freq: Once | ORAL | Status: AC
  Administered 2014-07-26: 500 mg via ORAL
  Filled 2014-07-26: qty 1

## 2014-07-26 MED ORDER — METRONIDAZOLE 500 MG PO TABS
1500.0000 mg | ORAL_TABLET | Freq: Once | ORAL | Status: AC
  Administered 2014-07-26: 1500 mg via ORAL
  Filled 2014-07-26: qty 3

## 2014-07-26 MED ORDER — FLUCONAZOLE 150 MG PO TABS
150.0000 mg | ORAL_TABLET | Freq: Once | ORAL | Status: AC
  Administered 2014-07-26: 150 mg via ORAL
  Filled 2014-07-26: qty 1

## 2014-07-26 NOTE — MAU Note (Signed)
C/o recurrent yeast infections before her periods;

## 2014-07-26 NOTE — MAU Note (Signed)
Patient states has yeast infection has had in the past right before period, LMP 07/02/14

## 2014-09-01 ENCOUNTER — Emergency Department (HOSPITAL_COMMUNITY)
Admission: EM | Admit: 2014-09-01 | Discharge: 2014-09-01 | Disposition: A | Discharge status: Home / Self Care | Payer: Managed Care, Other (non HMO) | Attending: Physician Assistant | Admitting: Physician Assistant

## 2014-09-01 ENCOUNTER — Encounter (HOSPITAL_COMMUNITY): Payer: Self-pay | Admitting: *Deleted

## 2014-09-01 DIAGNOSIS — J45909 Unspecified asthma, uncomplicated: Secondary | ICD-10-CM | POA: Insufficient documentation

## 2014-09-01 DIAGNOSIS — Z7952 Long term (current) use of systemic steroids: Secondary | ICD-10-CM | POA: Diagnosis not present

## 2014-09-01 DIAGNOSIS — G43809 Other migraine, not intractable, without status migrainosus: Secondary | ICD-10-CM | POA: Insufficient documentation

## 2014-09-01 DIAGNOSIS — Z79899 Other long term (current) drug therapy: Secondary | ICD-10-CM | POA: Insufficient documentation

## 2014-09-01 DIAGNOSIS — R51 Headache: Secondary | ICD-10-CM | POA: Diagnosis present

## 2014-09-01 DIAGNOSIS — Z720 Tobacco use: Secondary | ICD-10-CM | POA: Insufficient documentation

## 2014-09-01 MED ORDER — SODIUM CHLORIDE 0.9 % IV BOLUS (SEPSIS)
1000.0000 mL | Freq: Once | INTRAVENOUS | Status: AC
  Administered 2014-09-01: 1000 mL via INTRAVENOUS

## 2014-09-01 MED ORDER — OXYCODONE-ACETAMINOPHEN 5-325 MG PO TABS: 1.0000 | ORAL_TABLET | Freq: Once | ORAL | Status: DC

## 2014-09-01 MED ORDER — DEXAMETHASONE SODIUM PHOSPHATE 10 MG/ML IJ SOLN
10.0000 mg | Freq: Once | INTRAMUSCULAR | Status: AC
  Administered 2014-09-01: 10 mg via INTRAVENOUS
  Filled 2014-09-01: qty 1

## 2014-09-01 MED ORDER — ONDANSETRON HCL 4 MG PO TABS: 4.0000 mg | ORAL_TABLET | Freq: Once | ORAL | Status: DC

## 2014-09-01 MED ORDER — DIPHENHYDRAMINE HCL 50 MG/ML IJ SOLN
25.0000 mg | Freq: Once | INTRAMUSCULAR | Status: AC
  Administered 2014-09-01: 25 mg via INTRAVENOUS
  Filled 2014-09-01: qty 1

## 2014-09-01 MED ORDER — METOCLOPRAMIDE HCL 5 MG/ML IJ SOLN
10.0000 mg | Freq: Once | INTRAMUSCULAR | Status: AC
  Administered 2014-09-01: 10 mg via INTRAVENOUS
  Filled 2014-09-01: qty 2

## 2014-09-01 NOTE — ED Notes (Signed)
Pt. Left with all belongings and refused wheelchair. Discharge instructions were reviewed and all questions were answered.  

## 2014-09-01 NOTE — ED Provider Notes (Signed)
CSN: [id number]     Arrival date & time 09/01/14  1740 History   First MD Initiated Contact with Patient 09/01/14 1937     Chief Complaint  Patient presents with  . Headache  . Nausea   Patient is a 24 y.o. female presenting with headaches. The history is provided by the patient. No language interpreter was used.  Headache Pain location:  Generalized Quality: throbbing. Radiates to:  Does not radiate Severity currently:  8/10 Severity at highest:  8/10 Onset quality:  Gradual Timing:  Constant Progression:  Worsening Chronicity:  Recurrent Similar to prior headaches: yes   Context: bright light and loud noise   Context: not activity   Relieved by:  Nothing Worsened by:  Light and sound Ineffective treatments:  NSAIDs Associated symptoms: photophobia   Associated symptoms: no abdominal pain, no back pain, no blurred vision, no cough, no diarrhea, no dizziness, no fever, no focal weakness, no near-syncope, no neck pain, no numbness, no paresthesias, no seizures, no syncope, no tingling, no URI and no weakness     Past Medical History  Diagnosis Date  . Allergy   . Asthma    Past Surgical History  Procedure Laterality Date  . Anterior cervical decomp/discectomy fusion N/A 05/16/2013    Procedure: Cervical six-seven Anterior cervical decompression/diskectomy/fusion;  Surgeon: Barnett Abu, MD;  Location: [facility];  Service: Neurosurgery;  Laterality: N/A;  Cervical six-seven Anterior cervical decompression/diskectomy/fusion   Family History  Problem Relation Age of Onset  . Diabetes Mother   . Hypertension Mother   . Asthma Mother    Social History  Substance Use Topics  . Smoking status: Current Every Day Smoker -- 0.50 packs/day for 8 years    Types: Cigarettes  . Smokeless tobacco: None  . Alcohol Use: 1.8 oz/week    3 Shots of liquor per week   OB History    Gravida Para Term Preterm AB TAB SAB Ectopic Multiple Living   0 0 0 0 0 0 0 0 0 0       Review of  Systems  Constitutional: Negative for fever, activity change and appetite change.  Eyes: Positive for photophobia. Negative for blurred vision.  Respiratory: Negative for cough.   Cardiovascular: Negative for syncope and near-syncope.  Gastrointestinal: Negative for abdominal pain and diarrhea.  Musculoskeletal: Negative for back pain and neck pain.  Neurological: Positive for headaches. Negative for dizziness, tremors, focal weakness, seizures, syncope, facial asymmetry, speech difficulty, weakness, light-headedness, numbness and paresthesias.  All other systems reviewed and are negative.   Allergies  Review of patient's allergies indicates no known allergies.  Home Medications   Prior to Admission medications   Medication Sig Start Date End Date Taking? Authorizing Provider  ibuprofen (ADVIL,MOTRIN) 200 MG tablet Take 400 mg by mouth every 6 (six) hours as needed for moderate pain.   Yes Historical Provider, MD  prednisoLONE acetate (PRED FORTE) 1 % ophthalmic suspension Place 1 drop into both eyes 3 (three) times daily.   Yes Historical Provider, MD  cyclobenzaprine (FLEXERIL) 10 MG tablet Take 1 tablet (10 mg total) by mouth 3 (three) times daily as needed for muscle spasms. Patient not taking: Reported on 07/26/2014 06/14/14   Roxy Horseman, PA-C  fluconazole (DIFLUCAN) 150 MG tablet Take 1 tablet (150 mg total) by mouth once. 07/26/14   Jean Rosenthal, NP  meloxicam (MOBIC) 15 MG tablet Take 1 tablet (15 mg total) by mouth daily. Patient not taking: Reported on 07/26/2014 05/31/14   Joni Reining  Bush V, PA-C   BP 92/49 mmHg  Pulse 53  Temp(Src) 99.3 F (37.4 C) (Oral)  Resp 16  SpO2 99%  LMP 08/24/2014   Physical Exam  Constitutional: She appears well-developed and well-nourished. No distress.  HENT:  Head: Normocephalic and atraumatic.  Eyes: Conjunctivae are normal. Pupils are equal, round, and reactive to light.  Neck: Normal range of motion. Neck supple.  Cardiovascular:  Normal rate and normal heart sounds.   Pulmonary/Chest: Effort normal and breath sounds normal.  Abdominal: Soft. Bowel sounds are normal. She exhibits no distension. There is no tenderness.  Musculoskeletal: Normal range of motion.  Neurological: She is alert. She displays no atrophy, no tremor and normal reflexes. No cranial nerve deficit or sensory deficit. She exhibits normal muscle tone. She displays no seizure activity. Coordination and gait normal. GCS eye subscore is 4. GCS verbal subscore is 5. GCS motor subscore is 6.  Reflex Scores:      Tricep reflexes are 2+ on the right side and 2+ on the left side.      Bicep reflexes are 2+ on the right side and 2+ on the left side.      Brachioradialis reflexes are 2+ on the right side and 2+ on the left side.      Patellar reflexes are 2+ on the right side and 2+ on the left side.      Achilles reflexes are 2+ on the right side and 2+ on the left side. Skin: She is not diaphoretic.    ED Course  Procedures   Imaging Review No results found. I have personally reviewed and evaluated these images and lab results as part of my medical decision-making.   EKG Interpretation None      MDM  Patient is a 24 year old female with past medical history of migraines when she was a teenager presenting with generalized throbbing headache associated with photophobia and phonophobia. Patient denies any neurologic symptoms including vision chang,e facial droop, slurred speech, or any other focal numbness weakness or tingling. Patient has been taking ibuprofen at home without relief. Patient has had a history of normal menstrual periods will LMP ending one week ago. Patient denies any vaginal bleeding or vaginal discharge. Of note patient was treated for trichomonas and yeast infection 1 month ago which time she was having vaginal itching as well as discharge. Patient denies any of these symptoms today. Patient does note urinary urgency but denies dysuria or  frequency. Patient has not had any other sexual intercourse over the past month since her previous treatment for her above-noted STI.  Exam above notable for a young female lying in bed in no acute distress with lights off. Afebrile. Not tachycardic. Breathing well on room air without tachypnea. Abdominal exam benign. Cardiovascular exam showed regular rate and rhythm. Neuro exam nonfocal. Remainder exam is unremarkable.  Patient given IV fluids as well as migraine cocktail with resolution of her symptoms. Attempted to obtain urine sample to check UA to rule out infection and pregnancy but patient unable to urinate - talked with patient about risk of note obtaining this to r/o aforementioned etiologies and patient has opted to forgo this as she is now asymptomatic.  Patient discharged home in stable condition and will follow up with her primary care physician to discuss further preventative versus abortive medications for her now current migraines. Strict return impressions were discussed with patient. Patient understands and agrees with the plan and has no further questions or concerns this time.  Patient care discussed with him by my attending Dr. Corlis Leak  Final diagnoses:  Other migraine without status migrainosus, not intractable    Angelina Ok, MD 09/01/14 2346  Courteney Lyn Mackuen, MD 09/01/14 2350

## 2014-09-01 NOTE — Discharge Instructions (Signed)
Migraine Headache °A migraine headache is very bad, throbbing pain on one or both sides of your head. Talk to your doctor about what things may bring on (trigger) your migraine headaches. °HOME CARE °· Only take medicines as told by your doctor. °· Lie down in a dark, quiet room when you have a migraine. °· Keep a journal to find out if certain things bring on migraine headaches. For example, write down: °¨ What you eat and drink. °¨ How much sleep you get. °¨ Any change to your diet or medicines. °· Lessen how much alcohol you drink. °· Quit smoking if you smoke. °· Get enough sleep. °· Lessen any stress in your life. °· Keep lights dim if bright lights bother you or make your migraines worse. °GET HELP RIGHT AWAY IF:  °· Your migraine becomes really bad. °· You have a fever. °· You have a stiff neck. °· You have trouble seeing. °· Your muscles are weak, or you lose muscle control. °· You lose your balance or have trouble walking. °· You feel like you will pass out (faint), or you pass out. °· You have really bad symptoms that are different than your first symptoms. °MAKE SURE YOU:  °· Understand these instructions. °· Will watch your condition. °· Will get help right away if you are not doing well or get worse. °Document Released: 10/02/2007 Document Revised: 03/17/2011 Document Reviewed: 08/30/2012 °ExitCare® Patient Information ©2015 [employer]. This information is not intended to replace advice given to you by your health care provider. Make sure you discuss any questions you have with your health care provider. ° ° °Emergency Department Resource Guide °1) Find a Doctor and Pay Out of Pocket °Although you won't have to find out who is covered by your insurance plan, it is a good idea to ask around and get recommendations. You will then need to call the office and see if the doctor you have chosen will accept you as a new patient and what types of options they offer for patients who are self-pay. Some doctors  offer discounts or will set up payment plans for their patients who do not have insurance, but you will need to ask so you aren't surprised when you get to your appointment. ° °2) Contact Your Local Health Department °Not all health departments have doctors that can see patients for sick visits, but many do, so it is worth a call to see if yours does. If you don't know where your local health department is, you can check in your phone book. The CDC also has a tool to help you locate your state's health department, and many state websites also have listings of all of their local health departments. ° °3) Find a Walk-in Clinic °If your illness is not likely to be very severe or complicated, you may want to try a walk in clinic. These are popping up all over the country in pharmacies, drugstores, and shopping centers. They're usually staffed by nurse practitioners or physician assistants that have been trained to treat common illnesses and complaints. They're usually fairly quick and inexpensive. However, if you have serious medical issues or chronic medical problems, these are probably not your best option. ° °No Primary Care Doctor: °- Call Health Connect at  832-8000 - they can help you locate a primary care doctor that  accepts your insurance, provides certain services, etc. °- Physician Referral Service- 1-800-533-3463 ° °Chronic Pain Problems: °Organization         Address    Phone   Notes  °[facility]  (336) 297-2271 Patients need to be referred by their primary care doctor.  ° °Medication Assistance: °Organization         Address  Phone   Notes  °[place] [facility] [address] °[place], [place] [address] (336) 641-8030 --Must be a resident of [place] °-- Must have NO insurance coverage whatsoever (no Medicaid/ Medicare, etc.) °-- The pt. MUST have a primary care doctor that directs their care regularly and follows them in the community °   °[facility]  (866) 331-1348   °[employer]  (888) 892-1162   ° °Agencies that provide inexpensive medical care: °Organization         Address  Phone   Notes  °[facility]  (336) 832-8035   °[facility]    (336) 832-7272   °[facility] [address] °[place], [place] [address] (336) 832-4777   °[facility] [address], °[place] (336) 271-4999   °[facility]    (336) 373-0678   °[facility]    (336) 272-1050   °[facility] ° [address], [place] Phone:  (336) 832-4444, Fax:  (336) 832-4440 Hours of Operation:  9 am - 6 pm, M-F.  Also accepts Medicaid/Medicare and self-pay.  °[facility] ° [address], [place] Phone: (336) 832-3150, Fax: (336) 832-3151. Hours of Operation:  8:30 am - 5:30 pm, M-F.  Also accepts Medicaid and self-pay.  °[facility] [place] [address], [place] Phone: (336) 878-6027   °[facility] [address], [place], [place] (336)723-1848, Ext. 123 Mondays & Thursdays: 7-9 AM.  First 15 patients are seen on a first come, first serve basis. °  ° °Medicaid-accepting [place] Providers: ° °Organization         Address  Phone   Notes  °[facility] [address], [place] (336) 641-2100 Also accepts self-pay patients.  °[facility] [address], [place] ° (336) 856-9996   °[facility] [address], [place] (336) 288-8857   °[facility] [address] [place] [address], [place] (336) 299-7000   °Veita Bland [address], [place]  ° (336) 373-1557 Only accepts [place] Access Medicaid patients after they have their name applied to their card.  ° °Self-Pay (no insurance) in [place]: ° °Organization         Address  Phone   Notes  °Sickle Cell Patients, [facility] [address], [place] (336) 832-1970   °[facility] [address], [place] (336) 832-4400   °[facility] ° [address], [place] (336) 992-4800   °[facility]/Dr. Osei-Bonsu ° [address] [place] [address], [place] or [address], [place] (336) 841-8500 Phone number for both [place] and [place] locations is the same.  °[facility] [address], [place] (336) 299-0000   °[facility]  [address] [place] [address],  or [address] (336) 852-7530 °(336) 878-2260   °[facility] [address],  (336) 350-1642, phone; (336) 294-5005, fax Sees patients 1st and 3rd Saturday of every month.  Must not qualify for public   or private insurance (i.e. Medicaid, Medicare, [place] Health Choice, Veterans' Benefits)  Household income should be no more than 200% of the poverty level The clinic cannot treat you if you are pregnant or think you are pregnant  Sexually transmitted diseases are not treated at the clinic.    Dental Care: Organization         Address  Phone  Notes  [facility] [place] [phone] Accepts children up to age 66 who are enrolled in [place] or [place]; pregnant women with a Medicaid card; and children who have applied for Medicaid or [place] Health Choice, but were declined, whose parents can pay a reduced fee at time of service.  [facility]  [address], [place] (361) 499-3467 Accepts children up to age 38 who are enrolled in [place] or [place]; pregnant women with a Medicaid card; and children who have applied for Medicaid or [place] Health Choice, but were declined, whose parents can pay a reduced fee at time of service.  [facility]  [place] (501)159-6478 Patients are seen by appointment only. Walk-ins are not accepted. [place] will see patients 32 years of age and older. Monday - Tuesday (8am-5pm) Most Wednesdays (8:30-5pm) $30 per visit, cash only  [facility]  [address], [facility] [phone] Patients are seen by appointment only. Walk-ins are not accepted. [place] will see patients 40 years of age and older. One Wednesday Evening (Monthly: Volunteer Based).  $30 per visit, cash only  [place]  [phone] for adults; Children under age 59, call Graduate Pediatric Dentistry at ([phone]. Children aged 66-14, please call [phone] to request a pediatric application.  Dental services are provided in all areas of dental care including fillings, crowns and bridges, complete and partial dentures, implants, gum treatment, root canals, and extractions. Preventive care is also provided. Treatment is provided to both adults and children. Patients are selected via a lottery and there is often a waiting list.   [facility] [address], [place]  (804)[phone] [email or web address]   [facility] [address] [place], [place] [phone], Ext. 123 Second and Fourth Thursday of each month, opens at 6:30 AM; Clinic ends at 9 AM.  Patients are seen on a first-come first-served basis, and a limited number are seen during each clinic.   [facility]  [address] [place], [place] [phone]   Eligibility Requirements You must have lived in [place], [place], or [place] counties for at least the last three months.   You cannot be eligible for state or federal sponsored [employer], including [employer], [place], or [employer].   You generally cannot be eligible for healthcare insurance through your employer.    How to apply: Eligibility screenings are held every Tuesday and  Wednesday afternoon from 1:00 pm until 4:00 pm. You do not need an appointment for the interview!  [facility] [address], [place], [place]   [place]  [place] Department  [place]  [phone]    Behavioral Health Resources in the Community: Intensive Outpatient Programs Organization         Address  Phone  Notes  [facility] [address], [place], [place] 336-878-6098   °[facility] [address], [place], [place] 336-832-9800   °[facility] [address], [place], [place] ° 336-882-2125   °[place] [address],  °[place], [place] 1-800-853-5163 or 336-641-4981   °Substance Abuse Resources °Organization         Address  Phone  Notes  °[facility]  336-882-2125   °[facility]  336-784-9470   °[facility]  336-285-9073   °[facility]  336-845-3988   °Residential & Outpatient Substance Abuse Program  1-800-659-3381   °Psychological Services °Organization         Address  Phone  Notes  °[facility]  336- 832-9600   °[facility]  336- 378-7881   °[facility] [address], [place] 1-800-853-5163 or 336-641-4981   ° °Mobile Crisis Teams °Organization         Address  Phone  Notes  °[facility], Mobile Crisis Care Unit  1-877-626-1772   °Assertive °[facility] ° [address] [place], [place] 336-834-9664   °Sharon DeEsch [address] °[place] 336-554-5454   ° °Self-Help/Support Groups °Organization         Address  Phone             Notes  °[facility] [place] - variety of support groups  336- 373-1402 Call for more information  °Narcotics Anonymous (NA), [facility] [address], °[place]  2 meetings at this location  ° °Residential  Treatment Programs °Organization         Address  Phone  Notes  °[facility] [address],    °[place]  1-866-801-8205   °[facility] ° [address], [place], [place] 704-293-8524   °[facility] [address], [place] 336-845-3988 Admissions: 8am-3pm M-F  °[facility] [address]    °[place], [place] 336-841-1104   °[facility] [address], [place], [place] 336-379-7146   °[facility] [address]  °[place], [place] 336-285-9073   °[facility] - Intensive Outpatient [address], [place], [place] 336-852-3033   °[facility] [address]  °[place], [place] 1-877-615-2722 or 336-784-9470   °[facility] ([facility] [address] [place], [place] 336-227-7417 Accepts Medicaid  °[facility] [address]  ° [place] 1-800-659-3381 Substance Abuse/Addiction Treatment  ° °[place] Behavioral Health Resources °Organization         Address  Phone  Notes  °[facility]  (888) 581-9988   °Julie Brannon, PhD [address] [place], [place]   (336) 349-5553 or (336) 951-0000   °[facility]   [address] °[place], [place] (336) 349-4454   °[facility] [address], [place], [place] (336) 342-8316 Insurance/Medicaid/sponsorship through [facility]  °[facility] [address]                                    [place], [place] (336) 342-8316 Therapy/tele-psych/case  °[facility] [address]  ° , [place] (336) 349-2233    °Dr. Arfeen  (336) 349-4544   °[facility] [place]  [employer] [place] [facility]   Dept. 1) [address], [place] °2) [address], [place] °3)  [address], [place] (336) 349-3220 °(336) 342-7768 ° °(336) 342-8140   °[place] Child Abuse Hotline (336) 342-1394 or (336) 342-3537 (After Hours)     ° ° ° °

## 2014-09-01 NOTE — ED Notes (Signed)
Pt reports headache and nausea for several days. Pt states that the headache is intermittent.

## 2014-09-01 NOTE — ED Provider Notes (Signed)
I saw and evaluated the patient, reviewed the resident's note and I agree with the findings and plan.   EKG Interpretation None     Patient is 24 year old female with history of migraine presenting here with migraine today. We'll give migraine cocktail. Anticipate her feeling better and going home. No other symptoms. Doubt SAH because of slow onset and photophobia and similar feeling to prior migraines.    Marieke Lubke Randall An, MD 09/01/14 2039

## 2014-09-13 ENCOUNTER — Ambulatory Visit: Payer: Managed Care, Other (non HMO)

## 2014-09-13 ENCOUNTER — Inpatient Hospital Stay (HOSPITAL_COMMUNITY)
Admission: AD | Admit: 2014-09-13 | Discharge: 2014-09-13 | Disposition: A | Discharge status: Home / Self Care | Payer: Managed Care, Other (non HMO) | Source: Ambulatory Visit | Attending: Obstetrics and Gynecology | Admitting: Obstetrics and Gynecology

## 2014-09-13 DIAGNOSIS — N6011 Diffuse cystic mastopathy of right breast: Secondary | ICD-10-CM

## 2014-09-13 DIAGNOSIS — N6012 Diffuse cystic mastopathy of left breast: Secondary | ICD-10-CM

## 2014-09-13 DIAGNOSIS — F17210 Nicotine dependence, cigarettes, uncomplicated: Secondary | ICD-10-CM | POA: Diagnosis not present

## 2014-09-13 DIAGNOSIS — Z803 Family history of malignant neoplasm of breast: Secondary | ICD-10-CM | POA: Insufficient documentation

## 2014-09-13 DIAGNOSIS — N644 Mastodynia: Secondary | ICD-10-CM | POA: Insufficient documentation

## 2014-09-13 DIAGNOSIS — N6019 Diffuse cystic mastopathy of unspecified breast: Secondary | ICD-10-CM | POA: Diagnosis not present

## 2014-09-13 LAB — URINALYSIS, ROUTINE W REFLEX MICROSCOPIC
Bilirubin Urine: NEGATIVE
Glucose, UA: NEGATIVE mg/dL
Hgb urine dipstick: NEGATIVE
KETONES UR: 15 mg/dL — AB
LEUKOCYTES UA: NEGATIVE
NITRITE: NEGATIVE
PROTEIN: NEGATIVE mg/dL
Specific Gravity, Urine: >1.03 — ABNORMAL HIGH (ref 1.005–1.030)
Urobilinogen, UA: 0.2 mg/dL (ref 0.0–1.0)
pH: 6 (ref 5.0–8.0)

## 2014-09-13 LAB — POCT PREGNANCY, URINE: PREG TEST UR: NEGATIVE

## 2014-09-13 NOTE — MAU Note (Signed)
Onset of breast soreness about 1 week worse today, no drainage from breasts, LMP 2 weeks ago.

## 2014-09-13 NOTE — MAU Provider Note (Signed)
Chief Complaint: Breast Pain   First Provider Initiated Contact with Patient 09/13/14 1836      SUBJECTIVE HPI: Sierra Long is a 24 y.o. G0P0000 who presents to maternity admissions reporting pain in both breasts x 1 week, worsening over time.  The pain is constant, described as soreness or tenderness.  It does not radiate.  It is not associated with nipple discharge.  She has tried ibuprofen for the pain but it does not seem to help.  She can feel lumps in both sides, possibly several small ones.  She has family history of an aunt with breast cancer in her 32s and is worried about this.  She reports recent increase in exercise because she is trying to get into the fire [employer]. She also reports an increase in caffeine via soda in recent weeks.     Patient's last menstrual period was 08/24/2014. She denies abdominal pain, vaginal bleeding, vaginal itching/burning, urinary symptoms, h/a, dizziness, n/v, or fever/chills.     HPI  Past Medical History  Diagnosis Date  . Allergy   . Asthma    Past Surgical History  Procedure Laterality Date  . Anterior cervical decomp/discectomy fusion N/A 05/16/2013    Procedure: Cervical six-seven Anterior cervical decompression/diskectomy/fusion;  Surgeon: Barnett Abu, MD;  Location: [facility];  Service: Neurosurgery;  Laterality: N/A;  Cervical six-seven Anterior cervical decompression/diskectomy/fusion   Social History   Social History  . Marital Status: Single    Spouse Name: N/A  . Number of Children: N/A  . Years of Education: N/A   Occupational History  . Not on file.   Social History Main Topics  . Smoking status: Current Every Day Smoker -- 0.50 packs/day for 8 years    Types: Cigarettes  . Smokeless tobacco: Not on file  . Alcohol Use: 1.8 oz/week    3 Shots of liquor per week  . Drug Use: No  . Sexual Activity: Not on file   Other Topics Concern  . Not on file   Social History Narrative   No current  facility-administered medications on file prior to encounter.   Current Outpatient Prescriptions on File Prior to Encounter  Medication Sig Dispense Refill  . ibuprofen (ADVIL,MOTRIN) 200 MG tablet Take 400 mg by mouth every 6 (six) hours as needed for moderate pain.    . prednisoLONE acetate (PRED FORTE) 1 % ophthalmic suspension Place 1 drop into both eyes 3 (three) times daily.    . cyclobenzaprine (FLEXERIL) 10 MG tablet Take 1 tablet (10 mg total) by mouth 3 (three) times daily as needed for muscle spasms. (Patient not taking: Reported on 07/26/2014) 30 tablet 0  . fluconazole (DIFLUCAN) 150 MG tablet Take 1 tablet (150 mg total) by mouth once. (Patient not taking: Reported on 09/13/2014) 2 tablet 3  . meloxicam (MOBIC) 15 MG tablet Take 1 tablet (15 mg total) by mouth daily. (Patient not taking: Reported on 07/26/2014) 30 tablet 0   No Known Allergies  ROS:  Review of Systems  Constitutional: Negative for fever, chills and fatigue.  HENT: Negative for sinus pressure.   Eyes: Negative for photophobia.  Respiratory: Negative for shortness of breath.   Cardiovascular: Negative for chest pain.  Gastrointestinal: Negative for nausea, vomiting, diarrhea and constipation.  Genitourinary: Negative for dysuria, frequency, flank pain, vaginal bleeding, vaginal discharge, difficulty urinating, vaginal pain and pelvic pain.  Musculoskeletal: Negative for neck pain.  Neurological: Negative for dizziness, weakness and headaches.  Psychiatric/Behavioral: Negative.  I have reviewed patient's Past Medical Hx, Surgical Hx, Family Hx, Social Hx, medications and allergies.   Physical Exam  Patient Vitals for the past 24 hrs:  BP Temp Temp src Pulse Resp  09/13/14 1753 (!) 96/46 mmHg 98.6 F (37 C) Oral (!) 54 16   Constitutional: Well-developed, well-nourished female in no acute distress.  Cardiovascular: normal rate Respiratory: normal effort GI: Abd soft, non-tender. Pos BS x 4 Breast  exam: Multiple smooth, round, mobile masses palpable in right and left breasts with tenderness to palpation mostly in lower outer portion of both breasts MS: Extremities nontender, no edema, normal ROM Neurologic: Alert and oriented x 4.  GU: Neg CVAT.  PELVIC EXAM: Deferred   LAB RESULTS Results for orders placed or performed during the hospital encounter of 09/13/14 (from the past 24 hour(s))  Urinalysis, Routine w reflex microscopic (not at [facility])     Status: Abnormal   Collection Time: 09/13/14  5:50 PM  Result Value Ref Range   Color, Urine YELLOW YELLOW   APPearance HAZY (A) CLEAR   Specific Gravity, Urine >1.030 (H) 1.005 - 1.030   pH 6.0 5.0 - 8.0   Glucose, UA NEGATIVE NEGATIVE mg/dL   Hgb urine dipstick NEGATIVE NEGATIVE   Bilirubin Urine NEGATIVE NEGATIVE   Ketones, ur 15 (A) NEGATIVE mg/dL   Protein, ur NEGATIVE NEGATIVE mg/dL   Urobilinogen, UA 0.2 0.0 - 1.0 mg/dL   Nitrite NEGATIVE NEGATIVE   Leukocytes, UA NEGATIVE NEGATIVE  Pregnancy, urine POC     Status: None   Collection Time: 09/13/14  6:11 PM  Result Value Ref Range   Preg Test, Ur NEGATIVE NEGATIVE      IMAGING No results found.  MAU Management/MDM: Ordered labs and reviewed results.  Pt stable at time of discharge.  ASSESSMENT 1. Fibrocystic breast changes of both breasts   2. Breast tenderness in female     PLAN Discharge home Reassurance provided regarding breast exam and likely fibrocystic breast changes Referral to [facility] [place] Return to MAU as needed for emergencies    Medication List    STOP taking these medications        cyclobenzaprine 10 MG tablet  Commonly known as:  FLEXERIL     fluconazole 150 MG tablet  Commonly known as:  DIFLUCAN     meloxicam 15 MG tablet  Commonly known as:  MOBIC      TAKE these medications        ibuprofen 200 MG tablet  Commonly known as:  ADVIL,MOTRIN  Take 400 mg by mouth every 6 (six) hours as needed for moderate pain.      prednisoLONE acetate 1 % ophthalmic suspension  Commonly known as:  PRED FORTE  Place 1 drop into both eyes 3 (three) times daily.           Follow-up Information    Follow up with [facility].   Specialty:  Diagnostic Radiology   Why:  The [facility] will call you with appointment or call teh number below.   Contact information:   [address] [place] [address] [phone]       Follow up with [facility] [place] MATERNITY ADMISSIONS.   Why:  As needed for emergencies   Contact information:   [address] [id number] mc [place] [address] [phone]      Sharen Counter Certified Nurse-Midwife 09/13/2014  6:42 PM

## 2014-09-13 NOTE — Discharge Instructions (Signed)
Fibrocystic Breast Changes Fibrocystic breast changes occur when breast ducts become blocked, causing painful, fluid-filled lumps (cysts) to form in the breast. This is a common condition that is noncancerous (benign). It occurs when women go through hormonal changes during their menstrual cycle. Fibrocystic breast changes can affect one or both breasts. CAUSES  The exact cause of fibrocystic breast changes is not known, but it may be related to the female hormones estrogen and progesterone. Family traits that get passed from parent to child (genetics) may also be a factor in some cases. SIGNS AND SYMPTOMS   Tenderness, mild discomfort, or pain.   Swelling.   Ropelike feeling when touching the breast.   Lumpy breast, one or both sides.   Changes in breast size, especially before (larger) and after (smaller) the menstrual period.   Green or dark brown nipple discharge (not blood).  Symptoms are usually worse before menstrual periods start and get better toward the end of the menstrual period.  DIAGNOSIS  To make a diagnosis, your health care provider will ask you questions and perform a physical exam of your breasts. The health care provider may recommend other tests that can examine inside your breasts, such as:  A breast X-ray (mammogram).   Ultrasonography.  An MRI.  If something more than fibrocystic breast changes is suspected, your health care provider may take a breast tissue sample (breast biopsy) to examine. TREATMENT  Often, treatment is not needed. Your health care provider may recommend over-the-counter pain relievers to help lessen pain or discomfort caused by the fibrocystic breast changes. You may also be asked to change your diet to limit or stop eating foods or drinking beverages that contain caffeine. Foods and beverages that contain caffeine include chocolate, soda, coffee, and tea. Reducing sugar and fat in your diet may also help. Your health care provider may  also recommend:  Fine needle aspiration to remove fluid from a cyst that is causing pain.   Surgery to remove a large, persistent, and tender cyst. HOME CARE INSTRUCTIONS   Examine your breasts after every menstrual period. If you do not have menstrual periods, check your breasts the first day of every month. Feel for changes, such as more tenderness, a new growth, a change in breast size, or a change in a lump that has always been there.   Only take over-the-counter or prescription medicine as directed by your health care provider.   Wear a well-fitted support or sports bra, especially when exercising.   Decrease or avoid caffeine, fat, and sugar in your diet as directed by your health care provider.  SEEK MEDICAL CARE IF:   You have fluid leaking (discharge) from your nipples, especially bloody discharge.   You have new lumps or bumps in the breast.   Your breast or breasts become enlarged, red, and painful.   You have areas of your breast that pucker in.   Your nipples appear flat or indented.  Document Released: 10/09/2005 Document Revised: 12/28/2012 Document Reviewed: 06/13/2012 Exit[employer] Patient Information 2015 [employer]. This information is not intended to replace advice given to you by your health care provider. Make sure you discuss any questions you have with your health care provider.  

## 2014-09-14 ENCOUNTER — Other Ambulatory Visit (HOSPITAL_COMMUNITY): Payer: Self-pay | Admitting: Advanced Practice Midwife

## 2014-09-14 DIAGNOSIS — N6011 Diffuse cystic mastopathy of right breast: Secondary | ICD-10-CM

## 2014-09-14 DIAGNOSIS — N644 Mastodynia: Secondary | ICD-10-CM

## 2014-09-14 DIAGNOSIS — N6012 Diffuse cystic mastopathy of left breast: Principal | ICD-10-CM

## 2014-09-18 ENCOUNTER — Other Ambulatory Visit: Payer: Self-pay | Admitting: Advanced Practice Midwife

## 2014-09-23 ENCOUNTER — Inpatient Hospital Stay (HOSPITAL_COMMUNITY)
Admission: AD | Admit: 2014-09-23 | Discharge: 2014-09-23 | Disposition: A | Discharge status: Home / Self Care | Payer: Managed Care, Other (non HMO) | Source: Ambulatory Visit | Attending: Obstetrics & Gynecology | Admitting: Obstetrics & Gynecology

## 2014-09-23 ENCOUNTER — Encounter (HOSPITAL_COMMUNITY): Payer: Self-pay | Admitting: *Deleted

## 2014-09-23 DIAGNOSIS — F17210 Nicotine dependence, cigarettes, uncomplicated: Secondary | ICD-10-CM | POA: Diagnosis not present

## 2014-09-23 DIAGNOSIS — N644 Mastodynia: Secondary | ICD-10-CM

## 2014-09-23 LAB — URINALYSIS, ROUTINE W REFLEX MICROSCOPIC
BILIRUBIN URINE: NEGATIVE
Glucose, UA: NEGATIVE mg/dL
HGB URINE DIPSTICK: NEGATIVE
Ketones, ur: NEGATIVE mg/dL
Leukocytes, UA: NEGATIVE
Nitrite: NEGATIVE
PROTEIN: NEGATIVE mg/dL
Specific Gravity, Urine: 1.02 (ref 1.005–1.030)
Urobilinogen, UA: 1 mg/dL (ref 0.0–1.0)
pH: 6.5 (ref 5.0–8.0)

## 2014-09-23 LAB — POCT PREGNANCY, URINE: Preg Test, Ur: NEGATIVE

## 2014-09-23 NOTE — MAU Note (Signed)
Breast soreness was seen in MAU and scheduled for breast US on 9/23 at the [facility], has been taking Ibuprofen not helping anymore, hurting worse.

## 2014-09-23 NOTE — MAU Provider Note (Signed)
History     CSN: [id number]  Arrival date and time: 09/23/14 1614   First Provider Initiated Contact with Patient 09/23/14 1651      Chief Complaint  Patient presents with  . Breast Pain   HPI  Sierra Long is a 24 y.o. female who presents with breast pain.  Previously seen in MAU for same symptoms 10 days ago & scheduled to be seen at [facility] for ultrasound on 9/22.  Pain has continued and worsened over the last 3 days. Describes as constant throbbing. Is taking ibuprofen; not as effective as it was when she first started taking it.  Denies fever.  States decreased caffeine intake.  Currently on period.  Denies mass, edema, redness, or nipple discharge.  Does not wear a bra.     OB History    Gravida Para Term Preterm AB TAB SAB Ectopic Multiple Living        Past Medical History  Diagnosis Date  . Allergy   . Asthma     Past Surgical History  Procedure Laterality Date  . Anterior cervical decomp/discectomy fusion N/A 05/16/2013    Procedure: Cervical six-seven Anterior cervical decompression/diskectomy/fusion;  Surgeon: Barnett Abu, MD;  Location: [facility];  Service: Neurosurgery;  Laterality: N/A;  Cervical six-seven Anterior cervical decompression/diskectomy/fusion    Family History  Problem Relation Age of Onset  . Diabetes Mother   . Hypertension Mother   . Asthma Mother     Social History  Substance Use Topics  . Smoking status: Current Every Day Smoker -- 0.50 packs/day for 8 years    Types: Cigarettes  . Smokeless tobacco: None  . Alcohol Use: 1.8 oz/week    3 Shots of liquor per week    Allergies: No Known Allergies  Prescriptions prior to admission  Medication Sig Dispense Refill Last Dose  . ibuprofen (ADVIL,MOTRIN) 200 MG tablet Take 400 mg by mouth every 6 (six) hours as needed for moderate pain.   09/23/2014 at Unknown time  . prednisoLONE acetate (PRED FORTE) 1 % ophthalmic suspension Place 1 drop into  both eyes 3 (three) times daily.   Completed Course at Unknown time    Review of Systems  Constitutional: Negative.  Negative for fever.  Respiratory: Negative for cough and shortness of breath.   Gastrointestinal: Negative.   Genitourinary:       +vaginal bleeding   Physical Exam   Blood pressure 102/53, pulse 58, temperature 98.6 F (37 C), temperature source Oral, height 5' 5.5" (1.664 m), weight 62.324 kg (137 lb 6.4 oz), last menstrual period 08/24/2014.  Physical Exam  Nursing note and vitals reviewed. Constitutional: She is oriented to person, place, and time. She appears well-developed and well-nourished. No distress.  HENT:  Head: Normocephalic and atraumatic.  Eyes: Conjunctivae are normal. Right eye exhibits no discharge. Left eye exhibits no discharge. No scleral icterus.  Neck: Normal range of motion.  Cardiovascular: Normal rate, regular rhythm and normal heart sounds.   No murmur heard. Respiratory: Effort normal and breath sounds normal. No respiratory distress. She has no wheezes. Right breast exhibits tenderness. Right breast exhibits no inverted nipple and no nipple discharge. Left breast exhibits tenderness. Left breast exhibits no inverted nipple and no nipple discharge. Breasts are symmetrical.  Multiple small, mobile masses in bilateral breasts.  No palpable supraclavicular nodes.  No palpable masses in tail of spence  Neurological: She is alert and oriented to person,  place, and time.  Skin: Skin is warm and dry. She is not diaphoretic.  Psychiatric: She has a normal mood and affect. Her behavior is normal. Judgment and thought content normal.    MAU Course  Procedures Results for orders placed or performed during the hospital encounter of 09/23/14 (from the past 24 hour(s))  Urinalysis, Routine w reflex microscopic (not at [facility])     Status: None   Collection Time: 09/23/14  4:24 PM  Result Value Ref Range   Color, Urine YELLOW YELLOW   APPearance CLEAR  CLEAR   Specific Gravity, Urine 1.020 1.005 - 1.030   pH 6.5 5.0 - 8.0   Glucose, UA NEGATIVE NEGATIVE mg/dL   Hgb urine dipstick NEGATIVE NEGATIVE   Bilirubin Urine NEGATIVE NEGATIVE   Ketones, ur NEGATIVE NEGATIVE mg/dL   Protein, ur NEGATIVE NEGATIVE mg/dL   Urobilinogen, UA 1.0 0.0 - 1.0 mg/dL   Nitrite NEGATIVE NEGATIVE   Leukocytes, UA NEGATIVE NEGATIVE  Pregnancy, urine POC     Status: None   Collection Time: 09/23/14  4:29 PM  Result Value Ref Range   Preg Test, Ur NEGATIVE NEGATIVE    MDM UPT negative Today's breast exam is similar to that of 10 days ago per previous note.  Will continue with plan for patient to have [facility].   Assessment and Plan  A: 1. Breast pain in female    P: Discharge home Keep scheduled appt with [facility] Take tylenol &/or ibuprofen PRN Use warm compresses to breasts Wear well fitting bra Continue to limit caffeine intake  Judeth Horn, NP  09/23/2014, 4:53 PM

## 2014-09-23 NOTE — Discharge Instructions (Signed)

## 2014-09-28 ENCOUNTER — Ambulatory Visit
Admission: RE | Admit: 2014-09-28 | Discharge: 2014-09-28 | Disposition: A | Discharge status: Home / Self Care | Payer: Managed Care, Other (non HMO) | Source: Ambulatory Visit | Attending: Advanced Practice Midwife | Admitting: Advanced Practice Midwife

## 2014-09-28 ENCOUNTER — Inpatient Hospital Stay: Admission: RE | Admit: 2014-09-28 | Payer: Managed Care, Other (non HMO) | Source: Ambulatory Visit

## 2014-09-28 ENCOUNTER — Other Ambulatory Visit: Payer: Managed Care, Other (non HMO)

## 2014-09-28 DIAGNOSIS — N6011 Diffuse cystic mastopathy of right breast: Secondary | ICD-10-CM

## 2014-09-28 DIAGNOSIS — N644 Mastodynia: Secondary | ICD-10-CM

## 2014-09-28 DIAGNOSIS — N6012 Diffuse cystic mastopathy of left breast: Principal | ICD-10-CM

## 2015-03-10 ENCOUNTER — Encounter (HOSPITAL_COMMUNITY): Payer: Self-pay | Admitting: Family Medicine

## 2015-03-10 ENCOUNTER — Emergency Department (HOSPITAL_COMMUNITY)
Admission: EM | Admit: 2015-03-10 | Discharge: 2015-03-10 | Disposition: A | Discharge status: Home / Self Care | Payer: Managed Care, Other (non HMO) | Attending: Emergency Medicine | Admitting: Emergency Medicine

## 2015-03-10 ENCOUNTER — Emergency Department (HOSPITAL_COMMUNITY): Payer: Managed Care, Other (non HMO)

## 2015-03-10 DIAGNOSIS — Z7952 Long term (current) use of systemic steroids: Secondary | ICD-10-CM | POA: Insufficient documentation

## 2015-03-10 DIAGNOSIS — F17210 Nicotine dependence, cigarettes, uncomplicated: Secondary | ICD-10-CM | POA: Insufficient documentation

## 2015-03-10 DIAGNOSIS — J069 Acute upper respiratory infection, unspecified: Secondary | ICD-10-CM | POA: Insufficient documentation

## 2015-03-10 DIAGNOSIS — J45909 Unspecified asthma, uncomplicated: Secondary | ICD-10-CM | POA: Insufficient documentation

## 2015-03-10 DIAGNOSIS — R509 Fever, unspecified: Secondary | ICD-10-CM | POA: Diagnosis present

## 2015-03-10 LAB — URINALYSIS, ROUTINE W REFLEX MICROSCOPIC
BILIRUBIN URINE: NEGATIVE
GLUCOSE, UA: NEGATIVE mg/dL
HGB URINE DIPSTICK: NEGATIVE
Ketones, ur: 15 mg/dL — AB
Leukocytes, UA: NEGATIVE
Nitrite: NEGATIVE
PROTEIN: NEGATIVE mg/dL
Specific Gravity, Urine: 1.028 (ref 1.005–1.030)
pH: 7 (ref 5.0–8.0)

## 2015-03-10 LAB — BASIC METABOLIC PANEL
Anion gap: 10 (ref 5–15)
BUN: 12 mg/dL (ref 6–20)
CHLORIDE: 108 mmol/L (ref 101–111)
CO2: 25 mmol/L (ref 22–32)
CREATININE: 0.84 mg/dL (ref 0.44–1.00)
Calcium: 9.3 mg/dL (ref 8.9–10.3)
GFR calc Af Amer: >60 mL/min (ref >60)
GFR calc non Af Amer: >60 mL/min (ref >60)
GLUCOSE: 99 mg/dL (ref 65–99)
Potassium: 3.8 mmol/L (ref 3.5–5.1)
Sodium: 143 mmol/L (ref 135–145)

## 2015-03-10 LAB — CBC
HCT: 37.8 % (ref 36.0–46.0)
Hemoglobin: 12.5 g/dL (ref 12.0–15.0)
MCH: 28.3 pg (ref 26.0–34.0)
MCHC: 33.1 g/dL (ref 30.0–36.0)
MCV: 85.7 fL (ref 78.0–100.0)
PLATELETS: 324 10*3/uL (ref 150–400)
RBC: 4.41 MIL/uL (ref 3.87–5.11)
RDW: 13.3 % (ref 11.5–15.5)
WBC: 4.2 10*3/uL (ref 4.0–10.5)

## 2015-03-10 LAB — I-STAT BETA HCG BLOOD, ED (MC, WL, AP ONLY): I-stat hCG, quantitative: <5 m[IU]/mL (ref <5)

## 2015-03-10 MED ORDER — METOCLOPRAMIDE HCL 5 MG/ML IJ SOLN
10.0000 mg | Freq: Once | INTRAMUSCULAR | Status: AC
  Administered 2015-03-10: 10 mg via INTRAVENOUS
  Filled 2015-03-10: qty 2

## 2015-03-10 MED ORDER — KETOROLAC TROMETHAMINE 30 MG/ML IJ SOLN
30.0000 mg | Freq: Once | INTRAMUSCULAR | Status: AC
  Administered 2015-03-10: 30 mg via INTRAVENOUS
  Filled 2015-03-10: qty 1

## 2015-03-10 MED ORDER — SODIUM CHLORIDE 0.9 % IV BOLUS (SEPSIS)
500.0000 mL | Freq: Once | INTRAVENOUS | Status: AC
  Administered 2015-03-10: 500 mL via INTRAVENOUS

## 2015-03-10 MED ORDER — SODIUM CHLORIDE 0.9 % IV BOLUS (SEPSIS)
1000.0000 mL | Freq: Once | INTRAVENOUS | Status: AC
  Administered 2015-03-10: 1000 mL via INTRAVENOUS

## 2015-03-10 MED ORDER — DIPHENHYDRAMINE HCL 50 MG/ML IJ SOLN
25.0000 mg | Freq: Once | INTRAMUSCULAR | Status: AC
  Administered 2015-03-10: 25 mg via INTRAVENOUS
  Filled 2015-03-10: qty 1

## 2015-03-10 MED ORDER — ONDANSETRON HCL 4 MG/2ML IJ SOLN
4.0000 mg | Freq: Once | INTRAMUSCULAR | Status: AC
  Administered 2015-03-10: 4 mg via INTRAVENOUS
  Filled 2015-03-10: qty 2

## 2015-03-10 MED ORDER — ONDANSETRON HCL 4 MG PO TABS: 4.0000 mg | ORAL_TABLET | Freq: Four times a day (QID) | ORAL | Status: DC

## 2015-03-10 NOTE — ED Provider Notes (Signed)
CSN: [id number]     Arrival date & time 03/10/15  1856 History   First MD Initiated Contact with Patient 03/10/15 1932     Chief Complaint  Patient presents with  . Fever  . Weakness   HPI   Sierra Long is a 25 y.o. female PMH significant for asthma, seasonal allergies presenting with a 1 week history of weakness, loss of appetite. She states she was recently diagnosed with the flu one week ago and had fevers at home around 101. She denies any fevers within the last 3 days. She endorses a frontal headache that is constant, nonradiating, 10 out of 10 pain scale, pressure. She denies fevers, chills, CP, abdominal pain, N/V/D, urinary or vaginal complaints.   Past Medical History  Diagnosis Date  . Allergy   . Asthma    Past Surgical History  Procedure Laterality Date  . Anterior cervical decomp/discectomy fusion N/A 05/16/2013    Procedure: Cervical six-seven Anterior cervical decompression/diskectomy/fusion;  Surgeon: Barnett AbuHenry Elsner, MD;  Location: [facility];  Service: Neurosurgery;  Laterality: N/A;  Cervical six-seven Anterior cervical decompression/diskectomy/fusion   Family History  Problem Relation Age of Onset  . Diabetes Mother   . Hypertension Mother   . Asthma Mother    Social History  Substance Use Topics  . Smoking status: Current Every Day Smoker -- 0.50 packs/day for 8 years    Types: Cigarettes  . Smokeless tobacco: None  . Alcohol Use: 1.8 oz/week    3 Shots of liquor per week   OB History    Gravida Para Term Preterm AB TAB SAB Ectopic Multiple Living   0 0 0 0 0 0 0 0 0 0      Review of Systems  Ten systems are reviewed and are negative for acute change except as noted in the HPI  Allergies  Review of patient's allergies indicates no known allergies.  Home Medications   Prior to Admission medications   Medication Sig Start Date End Date Taking? Authorizing Provider  ibuprofen (ADVIL,MOTRIN) 200 MG tablet Take 400 mg by mouth every 6 (six) hours  as needed for moderate pain.    Historical Provider, MD  ondansetron (ZOFRAN) 4 MG tablet Take 1 tablet (4 mg total) by mouth every 6 (six) hours. 03/10/15   Melton KrebsSamantha Nicole Araf Clugston, PA-C  prednisoLONE acetate (PRED FORTE) 1 % ophthalmic suspension Place 1 drop into both eyes 3 (three) times daily.    Historical Provider, MD   BP 97/63 mmHg  Pulse 62  Temp(Src) 97.9 F (36.6 C)  Resp 18  SpO2 96%  LMP 03/06/2015 Physical Exam  Constitutional: She appears well-developed and well-nourished. No distress.  HENT:  Head: Normocephalic and atraumatic.  Mouth/Throat: Oropharynx is clear and moist. No oropharyngeal exudate.  Eyes: Conjunctivae are normal. Pupils are equal, round, and reactive to light. Right eye exhibits no discharge. Left eye exhibits no discharge. No scleral icterus.  Neck: No tracheal deviation present.  Cardiovascular: Normal rate, regular rhythm, normal heart sounds and intact distal pulses.  Exam reveals no gallop and no friction rub.   No murmur heard. Pulmonary/Chest: Effort normal and breath sounds normal. No respiratory distress. She has no wheezes. She has no rales. She exhibits no tenderness.  Abdominal: Soft. Bowel sounds are normal. She exhibits no distension and no mass. There is no tenderness. There is no rebound and no guarding.  Musculoskeletal: She exhibits no edema.  Lymphadenopathy:    She has no cervical adenopathy.  Neurological: She is  alert. Coordination normal.  Skin: Skin is warm and dry. No rash noted. She is not diaphoretic. No erythema.  Psychiatric: She has a normal mood and affect. Her behavior is normal.  Nursing note and vitals reviewed.   ED Course  Procedures  Labs Review Labs Reviewed  URINALYSIS, ROUTINE W REFLEX MICROSCOPIC (NOT AT [facility] - Abnormal; Notable for the following:    Ketones, ur 15 (*)    All other components within normal limits  BASIC METABOLIC PANEL  CBC  I-STAT BETA HCG BLOOD, ED ([facility], [facility], [facility] ONLY)    Imaging  Review Dg Chest 2 View  03/10/2015  CLINICAL DATA:  Nausea and weakness for 3 days.  Asthma. EXAM: CHEST  2 VIEW COMPARISON:  None. FINDINGS: The heart size and mediastinal contours are within normal limits. Both lungs are clear. Pulmonary hyperinflation noted. The visualized skeletal structures are unremarkable. IMPRESSION: Pulmonary hyperinflation.  No active lung disease. Electronically Signed   By: Myles Rosenthal M.D.   On: 03/10/2015 20:08   I have personally reviewed and evaluated these images and lab results as part of my medical decision-making. MDM   Final diagnoses:  Upper respiratory infection   Patient nontoxic appearing, vital signs stable. Will treat with headache cocktail, reassess, obtain basic labs as well. Beta hCG, UA, BMP, CBC, chest x-ray unremarkable. Patient feeling improved after headache cocktail. Pt CXR negative for acute infiltrate. Patients symptoms are consistent with URI, likely viral etiology. Discussed that antibiotics are not indicated for viral infections. Pt will be discharged with symptomatic treatment.  Verbalizes understanding and is agreeable with plan. Pt is hemodynamically stable & in NAD prior to dc.    Melton Krebs, PA-C 03/14/15 0139  Geoffery Lyons, MD 03/15/15 2257

## 2015-03-10 NOTE — ED Notes (Signed)
Pt here for weakness, loss of appetite, and recently dx with flu about 1 week ago.

## 2015-03-10 NOTE — ED Notes (Signed)
Pt ambulates independently and with steady gait at time of discharge. Discharge instructions and follow up information reviewed with patient. No other questions or concerns voiced at this time. RX x 1 given. 

## 2015-03-10 NOTE — Discharge Instructions (Signed)
[name],  Nice meeting you! Please follow-up with your primary care provider. Return to the emergency department if you develop shortness of breath, chest pain. Feel better soon!  S. Lane HackerNicole Real Cona, PA-C

## 2015-05-08 ENCOUNTER — Inpatient Hospital Stay (HOSPITAL_COMMUNITY)
Admission: AD | Admit: 2015-05-08 | Discharge: 2015-05-08 | Disposition: A | Discharge status: Home / Self Care | Payer: Managed Care, Other (non HMO) | Source: Ambulatory Visit | Attending: Family Medicine | Admitting: Family Medicine

## 2015-05-08 DIAGNOSIS — N6019 Diffuse cystic mastopathy of unspecified breast: Secondary | ICD-10-CM | POA: Diagnosis present

## 2015-05-08 DIAGNOSIS — N644 Mastodynia: Secondary | ICD-10-CM

## 2015-05-08 DIAGNOSIS — J45909 Unspecified asthma, uncomplicated: Secondary | ICD-10-CM | POA: Insufficient documentation

## 2015-05-08 DIAGNOSIS — Z8249 Family history of ischemic heart disease and other diseases of the circulatory system: Secondary | ICD-10-CM | POA: Diagnosis not present

## 2015-05-08 DIAGNOSIS — F17210 Nicotine dependence, cigarettes, uncomplicated: Secondary | ICD-10-CM | POA: Insufficient documentation

## 2015-05-08 DIAGNOSIS — Z833 Family history of diabetes mellitus: Secondary | ICD-10-CM | POA: Diagnosis not present

## 2015-05-08 NOTE — MAU Note (Signed)
Pt c/o soreness in both breasts. States that she feels like she has "knots" in them. Has had this pain for several months. LMP: 04/19/2015. Denies vaginal bleeding.

## 2015-05-08 NOTE — MAU Provider Note (Signed)
  History     CSN: [id number]  Arrival date and time: 05/08/15 2013   First Provider Initiated Contact with Patient 05/08/15 2111      Chief Complaint  Patient presents with  . Breast Problem   HPI  [name] is a 25 y.o. [id number] who presents to MAU today with complaint of chronic breast tenderness. The patient states that this has been an issue for many years. [facility] 6 months ago that was negative. She states that she continues to have daily pain. She takes Ibuprofen with some relief. She states pain is constant. She denies discharge or bleeding from the nipples. She denies any palpable lumps. She has been trying to limit caffeine.   OB History    Gravida Para Term Preterm AB TAB SAB Ectopic Multiple Living   0 0 0 0 0 0 0 0 0 0       Past Medical History  Diagnosis Date  . Allergy   . Asthma     Past Surgical History  Procedure Laterality Date  . Anterior cervical decomp/discectomy fusion N/A 05/16/2013    Procedure: Cervical six-seven Anterior cervical decompression/diskectomy/fusion;  Surgeon: Barnett AbuHenry Elsner, MD;  Location: [facility];  Service: Neurosurgery;  Laterality: N/A;  Cervical six-seven Anterior cervical decompression/diskectomy/fusion    Family History  Problem Relation Age of Onset  . Diabetes Mother   . Hypertension Mother   . Asthma Mother     Social History  Substance Use Topics  . Smoking status: Current Every Day Smoker -- 0.50 packs/day for 8 years    Types: Cigarettes  . Smokeless tobacco: Not on file  . Alcohol Use: 1.8 oz/week    3 Shots of liquor per week    Allergies: No Known Allergies  No prescriptions prior to admission    Review of Systems  Constitutional: Negative for fever and malaise/fatigue.  Genitourinary:       + breat pain   Physical Exam   Blood pressure 121/57, pulse 69, temperature 98.3 F (36.8 C), temperature source Oral, resp. rate 18, height 5' 5.5" (1.664 m), weight  130 lb (58.968 kg), last menstrual period 04/19/2015, SpO2 99 %.  Physical Exam  Nursing note and vitals reviewed. Constitutional: She is oriented to person, place, and time. She appears well-developed and well-nourished. No distress.  HENT:  Head: Normocephalic and atraumatic.  Cardiovascular: Normal rate.   Respiratory: Effort normal. Right breast exhibits tenderness. Right breast exhibits no inverted nipple, no mass, no nipple discharge and no skin change. Left breast exhibits tenderness. Left breast exhibits no inverted nipple, no mass, no nipple discharge and no skin change. Breasts are symmetrical.  Fibrocystic breast tissue noted bilaterally  GI: Soft.  Neurological: She is alert and oriented to person, place, and time.  Skin: Skin is warm and dry. No erythema.  Psychiatric: She has a normal mood and affect.    MAU Course  Procedures None  MDM No evidence of abscess. Will refer to the [facility] for further evaluation. Order placed. They should call patient with appointment. Patient also given contact information to follow-up  Assessment and Plan  A:  Fibrocystic breast changes  P: Discharge home Advised [place], ice and limit caffeine Patient advised to follow-up with the [facility] Patient may return to MAU as needed or if her condition were to change or worsen   Marny LowensteinJulie N Zoya Sprecher, PA-C  05/09/2015, 1:53 AM

## 2015-05-08 NOTE — Discharge Instructions (Signed)
Breast Tenderness Breast tenderness is a common problem for women of all ages. Breast tenderness may cause mild discomfort to severe pain. It has a variety of causes. Your health care provider will find out the likely cause of your breast tenderness by examining your breasts, asking you about symptoms, and ordering some tests. Breast tenderness usually does not mean you have breast cancer. HOME CARE INSTRUCTIONS  Breast tenderness often can be handled at home. You can try:  Getting fitted for a new bra that provides more support, especially during exercise.  Wearing a more supportive bra or sports bra while sleeping when your breasts are very tender.  If you have a breast injury, apply ice to the area:  Put ice in a plastic bag.  Place a towel between your skin and the bag.  Leave the ice on for 20 minutes, 2-3 times a day.  If your breasts are too full of milk as a result of breastfeeding, try:  Expressing milk either by hand or with a breast pump.  Applying a warm compress to the breasts for relief.  Taking over-the-counter pain relievers, if approved by your health care provider.  Taking other medicines that your health care provider prescribes. These may include antibiotic medicines or birth control pills. Over the long term, your breast tenderness might be eased if you:  Cut down on caffeine.  Reduce the amount of fat in your diet. Keep a log of the days and times when your breasts are most tender. This will help you and your health care provider find the cause of the tenderness and how to relieve it. Also, learn how to do breast exams at home. This will help you notice if you have an unusual growth or lump that could cause tenderness. SEEK MEDICAL CARE IF:   Any part of your breast is hard, red, and hot to the touch. This could be a sign of infection.  Fluid is coming out of your nipples (and you are not breastfeeding). Especially watch for blood or pus.  You have a fever  as well as breast tenderness.  You have a new or painful lump in your breast that remains after your menstrual period ends.  You have tried to take care of the pain at home, but it has not gone away.  Your breast pain is getting worse, or the pain is making it hard to do the things you usually do during your day.   This information is not intended to replace advice given to you by your health care provider. Make sure you discuss any questions you have with your health care provider.   Document Released: 12/06/2007 Document Revised: 08/25/2012 Document Reviewed: 07/22/2012 Elsevier Interactive Patient Education 2016 [employer] Fibrocystic Breast Changes Fibrocystic breast changes occur when breast ducts become blocked, causing painful, fluid-filled lumps (cysts) to form in the breast. This is a common condition that is noncancerous (benign). It occurs when women go through hormonal changes during their menstrual cycle. Fibrocystic breast changes can affect one or both breasts. CAUSES  The exact cause of fibrocystic breast changes is not known, but it may be related to the female hormones estrogen and progesterone. Family traits that get passed from parent to child (genetics) may also be a factor in some cases. SIGNS AND SYMPTOMS   Tenderness, mild discomfort, or pain.   Swelling.   Rope-like feeling when touching the breast.   Lumpy breast, one or both sides.   Changes in breast size, especially before (  before (larger) and after (smaller) the menstrual period.   °· Green or dark brown nipple discharge (not blood).   °Symptoms are usually worse before menstrual periods start and get better toward the end of the menstrual period.  °DIAGNOSIS  °To make a diagnosis, your health care provider will ask you questions and perform a physical exam of your breasts. The health care provider may recommend other tests that can examine inside your breasts, such as: °· A breast X-ray (mammogram).    °· Ultrasonography.  °· An MRI.   °If something more than fibrocystic breast changes is suspected, your health care provider may take a breast tissue sample (breast biopsy) to examine. °TREATMENT  °Often, treatment is not needed. Your health care provider may recommend over-the-counter pain relievers to help lessen pain or discomfort caused by the fibrocystic breast changes. You may also be asked to change your diet to limit or stop eating foods or drinking beverages that contain caffeine. Foods and beverages that contain caffeine include chocolate, soda, coffee, and tea. Reducing sugar and fat in your diet may also help. Your health care provider may also recommend: °· Fine needle aspiration to remove fluid from a cyst that is causing pain.   °· Surgery to remove a large, persistent, and tender cyst. °HOME CARE INSTRUCTIONS  °· Examine your breasts after every menstrual period. If you do not have menstrual periods, check your breasts the first day of every month. Feel for changes, such as more tenderness, a new growth, a change in breast size, or a change in a lump that has always been there.   °· Only take over-the-counter or prescription medicine as directed by your health care provider.   °· Wear a well-fitted support or sports bra, especially when exercising.   °· Decrease or avoid caffeine, fat, and sugar in your diet as directed by your health care provider.   °SEEK MEDICAL CARE IF:  °· You have fluid leaking (discharge) from your nipples, especially bloody discharge.   °· You have new lumps or bumps in the breast.   °· Your breast or breasts become enlarged, red, and painful.   °· You have areas of your breast that pucker in.   °· Your nipples appear flat or indented.   °  °This information is not intended to replace advice given to you by your health care provider. Make sure you discuss any questions you have with your health care provider. °  °Document Released: 10/09/2005 Document Revised: 09/13/2014  Document Reviewed: 06/13/2012 °Elsevier Interactive Patient Education ©2016 Elsevier Inc[employer] ° °

## 2015-05-09 ENCOUNTER — Other Ambulatory Visit: Payer: Self-pay | Admitting: Medical

## 2015-05-09 DIAGNOSIS — N644 Mastodynia: Secondary | ICD-10-CM

## 2015-05-30 ENCOUNTER — Encounter: Payer: Self-pay | Admitting: *Deleted

## 2015-06-05 ENCOUNTER — Encounter (HOSPITAL_COMMUNITY): Payer: Self-pay | Admitting: *Deleted

## 2015-06-05 ENCOUNTER — Emergency Department (HOSPITAL_COMMUNITY)
Admission: EM | Admit: 2015-06-05 | Discharge: 2015-06-05 | Disposition: A | Discharge status: Home / Self Care | Payer: Managed Care, Other (non HMO) | Attending: Emergency Medicine | Admitting: Emergency Medicine

## 2015-06-05 DIAGNOSIS — F17210 Nicotine dependence, cigarettes, uncomplicated: Secondary | ICD-10-CM | POA: Insufficient documentation

## 2015-06-05 DIAGNOSIS — G8929 Other chronic pain: Secondary | ICD-10-CM | POA: Insufficient documentation

## 2015-06-05 DIAGNOSIS — N644 Mastodynia: Secondary | ICD-10-CM | POA: Diagnosis not present

## 2015-06-05 NOTE — ED Notes (Signed)
Pt reports chronic breast pain, [facility], pt states, "they told me I don't have cancer." pt denies nipple discharge, pt reports bil breast lumps, pt reports decreasing caffeine, pt denies injury to the area, A&O x4

## 2015-06-05 NOTE — ED Notes (Signed)
Pt came and told registration that she couldn't wait any longer

## 2015-06-25 ENCOUNTER — Inpatient Hospital Stay (HOSPITAL_COMMUNITY)
Admission: AD | Admit: 2015-06-25 | Discharge: 2015-06-25 | Disposition: A | Discharge status: Home / Self Care | Payer: Managed Care, Other (non HMO) | Source: Ambulatory Visit | Attending: Obstetrics & Gynecology | Admitting: Obstetrics & Gynecology

## 2015-06-25 DIAGNOSIS — Z8249 Family history of ischemic heart disease and other diseases of the circulatory system: Secondary | ICD-10-CM | POA: Insufficient documentation

## 2015-06-25 DIAGNOSIS — G43909 Migraine, unspecified, not intractable, without status migrainosus: Secondary | ICD-10-CM | POA: Insufficient documentation

## 2015-06-25 DIAGNOSIS — Z825 Family history of asthma and other chronic lower respiratory diseases: Secondary | ICD-10-CM | POA: Diagnosis not present

## 2015-06-25 DIAGNOSIS — G43001 Migraine without aura, not intractable, with status migrainosus: Secondary | ICD-10-CM

## 2015-06-25 DIAGNOSIS — F17210 Nicotine dependence, cigarettes, uncomplicated: Secondary | ICD-10-CM | POA: Diagnosis not present

## 2015-06-25 DIAGNOSIS — Z833 Family history of diabetes mellitus: Secondary | ICD-10-CM | POA: Insufficient documentation

## 2015-06-25 LAB — URINALYSIS, ROUTINE W REFLEX MICROSCOPIC
Bilirubin Urine: NEGATIVE
GLUCOSE, UA: NEGATIVE mg/dL
HGB URINE DIPSTICK: NEGATIVE
Ketones, ur: NEGATIVE mg/dL
Leukocytes, UA: NEGATIVE
Nitrite: NEGATIVE
Protein, ur: NEGATIVE mg/dL
SPECIFIC GRAVITY, URINE: 1.02 (ref 1.005–1.030)
pH: 7 (ref 5.0–8.0)

## 2015-06-25 LAB — POCT PREGNANCY, URINE: Preg Test, Ur: NEGATIVE

## 2015-06-25 MED ORDER — ASPIRIN-ACETAMINOPHEN-CAFFEINE 250-250-65 MG PO TABS: 1.0000 | ORAL_TABLET | Freq: Four times a day (QID) | ORAL | Status: AC | PRN

## 2015-06-25 NOTE — MAU Provider Note (Signed)
MAU HISTORY AND PHYSICAL  Chief Complaint:  Migraine   Sierra Long is a 25 y.o.  G0P0000 with IUP at Unknown presenting for Migraine  Has history headaches. Typical headache occurs every few months. Somewhat sensitive to light, throbbing. 6 days ago developed throbbing headache, right temple to bilateral frontal. Light sensitive. Eyes watered. Some nausea. No aura. 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 Period happened on Saturday. Headache resolved after 24 or so hours. Then came back today, similar. Took ibuprofen earlier. Feeling improved but still pressure in head. No weakness or numbness. More stressed recently, sleeping less than normal.  Past Medical History  Diagnosis Date  . Allergy   . Asthma     Past Surgical History  Procedure Laterality Date  . Anterior cervical decomp/discectomy fusion N/A 05/16/2013    Procedure: Cervical six-seven Anterior cervical decompression/diskectomy/fusion;  Surgeon: Barnett AbuHenry Elsner, MD;  Location: [facility];  Service: Neurosurgery;  Laterality: N/A;  Cervical six-seven Anterior cervical decompression/diskectomy/fusion    Family History  Problem Relation Age of Onset  . Diabetes Mother   . Hypertension Mother   . Asthma Mother     Social History  Substance Use Topics  . Smoking status: Current Every Day Smoker -- 0.50 packs/day for 8 years    Types: Cigarettes  . Smokeless tobacco: Not on file  . Alcohol Use: 1.8 oz/week    3 Shots of liquor per week    No Known Allergies  Prescriptions prior to admission  Medication Sig Dispense Refill Last Dose  . ibuprofen (ADVIL,MOTRIN) 200 MG tablet Take 800 mg by mouth every 6 (six) hours as needed for headache or moderate pain.    06/25/2015 at Unknown time  . Multiple Vitamin (MULTIVITAMIN WITH MINERALS) TABS tablet Take 1 tablet by mouth daily.   Past Month at Unknown time  . ondansetron (ZOFRAN) 4 MG tablet Take 1 tablet (4 mg total) by mouth every 6 (six) hours. (Patient  not taking: Reported on 06/25/2015) 12 tablet 0 Not Taking at Unknown time    Review of Systems - Negative except for what is mentioned in HPI.  Physical Exam  Blood pressure 107/61, pulse 65, temperature 98 F (36.7 C), temperature source Oral, resp. rate 18, weight 132 lb (59.875 kg), last menstrual period 06/23/2015. GENERAL: Well-developed, well-nourished female in no acute distress.  LUNGS: Clear to auscultation bilaterally.  HEART: Regular rate and rhythm. ABDOMEN: Soft, nontender, nondistended, .  EXTREMITIES: Nontender, no edema, 2+ distal pulses. Neuro: cn 2-12 grossly intact. 5/5 upper and lower strength. Normal speech. Distal sensation to light touch intact.    Labs: Results for orders placed or performed during the hospital encounter of 06/25/15 (from the past 24 hour(s))  Pregnancy, urine POC   Collection Time: 06/25/15  6:16 PM  Result Value Ref Range   Preg Test, Ur NEGATIVE NEGATIVE    Imaging Studies:  No results found.  Assessment: Sierra Long is  25 y.o. G0P0000 at Unknown presents with headache, migranous in quality. No red flag symptoms, normal neuro exam. upt negative. May be related to menstruation but too soon to tell as this is one episode of headaches associated w/ menstruation.  Plan: - combination tylenol/caffeine/aspirin given this first instance of such symptoms in many years - needs a pcp - referring patient to [facility]  Silvano Bilisoah B Wouk 6/19/20176:36 PM

## 2015-06-25 NOTE — Discharge Instructions (Signed)
Migraine Headache  A migraine headache is very bad, throbbing pain on one or both sides of your head. Talk to your doctor about what things may bring on (trigger) your migraine headaches.  HOME CARE  · Only take medicines as told by your doctor.  · Lie down in a dark, quiet room when you have a migraine.  · Keep a journal to find out if certain things bring on migraine headaches. For example, write down:    What you eat and drink.    How much sleep you get.    Any change to your diet or medicines.  · Lessen how much alcohol you drink.  · Quit smoking if you smoke.  · Get enough sleep.  · Lessen any stress in your life.  · Keep lights dim if bright lights bother you or make your migraines worse.  GET HELP RIGHT AWAY IF:   · Your migraine becomes really bad.  · You have a fever.  · You have a stiff neck.  · You have trouble seeing.  · Your muscles are weak, or you lose muscle control.  · You lose your balance or have trouble walking.  · You feel like you will pass out (faint), or you pass out.  · You have really bad symptoms that are different than your first symptoms.  MAKE SURE YOU:   · Understand these instructions.  · Will watch your condition.  · Will get help right away if you are not doing well or get worse.     This information is not intended to replace advice given to you by your health care provider. Make sure you discuss any questions you have with your health care provider.     Document Released: 10/02/2007 Document Revised: 03/17/2011 Document Reviewed: 08/30/2012  Elsevier Interactive Patient Education ©2016 [employer]

## 2015-06-25 NOTE — MAU Note (Signed)
Pt states has had headache since Tuesday unrelieved by Ibuprofen (400mg ) taken today @ 0800.   Pt states has throbbing near temple/light sensitivity/nausea since this morning.  Pt is currently having her menstrual cycle.

## 2015-06-25 NOTE — MAU Note (Signed)
Menstrual cycle  Is going on right now. Is having a killer migraine, throbbing, light sensitive.  Never had cyclical migraines befores.

## 2015-10-21 ENCOUNTER — Encounter (HOSPITAL_COMMUNITY): Payer: Self-pay | Admitting: Emergency Medicine

## 2015-10-21 ENCOUNTER — Emergency Department (HOSPITAL_COMMUNITY): Payer: Managed Care, Other (non HMO)

## 2015-10-21 ENCOUNTER — Emergency Department (HOSPITAL_COMMUNITY)
Admission: EM | Admit: 2015-10-21 | Discharge: 2015-10-21 | Disposition: A | Discharge status: Home / Self Care | Payer: Managed Care, Other (non HMO) | Attending: Emergency Medicine | Admitting: Emergency Medicine

## 2015-10-21 DIAGNOSIS — S83412A Sprain of medial collateral ligament of left knee, initial encounter: Secondary | ICD-10-CM | POA: Insufficient documentation

## 2015-10-21 DIAGNOSIS — X58XXXA Exposure to other specified factors, initial encounter: Secondary | ICD-10-CM | POA: Diagnosis not present

## 2015-10-21 DIAGNOSIS — Z7982 Long term (current) use of aspirin: Secondary | ICD-10-CM | POA: Diagnosis not present

## 2015-10-21 DIAGNOSIS — J45909 Unspecified asthma, uncomplicated: Secondary | ICD-10-CM | POA: Diagnosis not present

## 2015-10-21 DIAGNOSIS — S8992XA Unspecified injury of left lower leg, initial encounter: Secondary | ICD-10-CM | POA: Diagnosis present

## 2015-10-21 DIAGNOSIS — Y929 Unspecified place or not applicable: Secondary | ICD-10-CM | POA: Diagnosis not present

## 2015-10-21 DIAGNOSIS — Y999 Unspecified external cause status: Secondary | ICD-10-CM | POA: Diagnosis not present

## 2015-10-21 DIAGNOSIS — F17210 Nicotine dependence, cigarettes, uncomplicated: Secondary | ICD-10-CM | POA: Diagnosis not present

## 2015-10-21 DIAGNOSIS — Y9301 Activity, walking, marching and hiking: Secondary | ICD-10-CM | POA: Insufficient documentation

## 2015-10-21 NOTE — ED Triage Notes (Signed)
Pt reports while walking this morning she "felt something pop" in her knee. Pt states she is able to bend knee, but the pain increases when she does. Denies injury.

## 2015-10-21 NOTE — Discharge Instructions (Addendum)
Please read attached information. If you experience any new or worsening signs or symptoms please return to the emergency room for evaluation. Please follow-up with your primary care provider or specialist as discussed.  °

## 2015-10-21 NOTE — ED Provider Notes (Signed)
[facility]-EMERGENCY DEPT Provider Note   CSN: [id number] Arrival date & time: 10/21/15  1622  By signing my name below, I, Lennie Muckle, attest that this documentation has been prepared under the direction and in the presence of [employer], PA-C. Electronically Signed: Lennie Muckle, Scribe. 10/21/15. 6:11 PM.   History   Chief Complaint Chief Complaint  Patient presents with  . Knee Pain   The history is provided by the patient. No language interpreter was used.   HPI Comments: Sierra Long is a 25 y.o. female who presents to the Emergency Department complaining of pain in the left knee after taking a wrong step down earlier today. Felt a pop in the left knee and feels like all of her muscles gave out. Has not had left knee pain in the past. Pain is greatest in the medial left knee area.    Past Medical History:  Diagnosis Date  . Allergy   . Asthma     Patient Active Problem List   Diagnosis Date Noted  . Herniated nucleus pulposus, C6-7 right 05/16/2013    Past Surgical History:  Procedure Laterality Date  . ANTERIOR CERVICAL DECOMP/DISCECTOMY FUSION N/A 05/16/2013   Procedure: Cervical six-seven Anterior cervical decompression/diskectomy/fusion;  Surgeon: Barnett Abu, MD;  Location: [facility];  Service: Neurosurgery;  Laterality: N/A;  Cervical six-seven Anterior cervical decompression/diskectomy/fusion    OB History    Gravida Para Term Preterm AB Living   0 0 0 0 0 0   SAB TAB Ectopic Multiple Live Births   0 0 0 0         Home Medications    Prior to Admission medications   Medication Sig Start Date End Date Taking? Authorizing Provider  aspirin-acetaminophen-caffeine (EXCEDRIN MIGRAINE) [phone] MG tablet Take 1 tablet by mouth every 6 (six) hours as needed for headache. 06/25/15   Kathrynn Running, MD  ibuprofen (ADVIL,MOTRIN) 200 MG tablet Take 800 mg by mouth every 6 (six) hours as needed for headache or moderate pain.     Historical Provider, MD     Family History Family History  Problem Relation Age of Onset  . Diabetes Mother   . Hypertension Mother   . Asthma Mother     Social History Social History  Substance Use Topics  . Smoking status: Current Every Day Smoker    Packs/day: 0.50    Years: 8.00    Types: Cigarettes  . Smokeless tobacco: Never Used  . Alcohol use 1.8 oz/week    3 Shots of liquor per week     Allergies   Review of patient's allergies indicates no known allergies.   Review of Systems Review of Systems  Musculoskeletal:       Left knee pain  All other systems reviewed and are negative.    Physical Exam Updated Vital Signs BP 100/69 (BP Location: Left Arm)   Pulse 61   Temp 97.7 F (36.5 C) (Oral)   Resp 16   Ht 5\' 5"  (1.651 m)   Wt 61.2 kg   LMP 10/07/2015   SpO2 99%   BMI 22.47 kg/m   Physical Exam  Constitutional: She is oriented to person, place, and time. She appears well-developed and well-nourished.  HENT:  Head: Normocephalic and atraumatic.  Eyes: Conjunctivae are normal. Pupils are equal, round, and reactive to light. Right eye exhibits no discharge. Left eye exhibits no discharge. No scleral icterus.  Neck: Normal range of motion. No JVD present. No tracheal deviation present.  Pulmonary/Chest: Effort normal. No stridor.  Musculoskeletal:  Pain to palpation over left medial knee, Valgus and Varus test normal, drawer test is normal.  Neurological: She is alert and oriented to person, place, and time. Coordination normal.  Psychiatric: She has a normal mood and affect. Her behavior is normal. Judgment and thought content normal.  Nursing note and vitals reviewed.    ED Treatments / Results  Labs (all labs ordered are listed, but only abnormal results are displayed) Labs Reviewed - No data to display  EKG  EKG Interpretation None       Radiology Dg Knee Complete 4 Views Left  Result Date: 10/21/2015 CLINICAL DATA:  Left knee pain medially. EXAM: LEFT  KNEE - COMPLETE 4+ VIEW COMPARISON:  None. FINDINGS: No evidence of fracture, dislocation, or joint effusion. No evidence of arthropathy or other focal bone abnormality. Soft tissues are unremarkable. IMPRESSION: No acute osseous injury of the left knee. Electronically Signed   By: Elige KoHetal  Patel   On: 10/21/2015 17:36    Procedures Procedures (including critical care time)  Medications Ordered in ED Medications - No data to display   Initial Impression / Assessment and Plan / ED Course  I have reviewed the triage vital signs and the nursing notes.  Pertinent labs & imaging results that were available during my care of the patient were reviewed by me and considered in my medical decision making (see chart for details).  Clinical Course  Labs:  Imaging: DG Knee Complete 4 Views Left  Consults:  Therapeutics:  Discharge Meds:   Assessment/Plan:  Informed the patient that there are no signs of fractures or acute injury to the left knee per the left knee XR films. It is likely sprained and will recover over the next few weeks. They can ice and elevate the left knee and use Ibuprofen 600 mg TID for pain relief as needed. Advised returning to the ED if any increased swelling or severe pain develops.   I personally performed the services described in this documentation, which was scribed in my presence. The recorded information has been reviewed and is accurate.    Final Clinical Impressions(s) / ED Diagnoses   Final diagnoses:  Sprain of medial collateral ligament of left knee, initial encounter    New Prescriptions New Prescriptions   No medications on file     Eyvonne MechanicJeffrey Derricka Mertz, PA-C 10/21/15 1811    Mancel BaleElliott Wentz, MD 10/21/15 [phone]

## 2015-10-21 NOTE — ED Notes (Signed)
X-ray at bedside

## 2016-09-19 ENCOUNTER — Encounter (HOSPITAL_COMMUNITY): Payer: Self-pay | Admitting: Family Medicine

## 2016-09-19 ENCOUNTER — Emergency Department (HOSPITAL_COMMUNITY)
Admission: EM | Admit: 2016-09-19 | Discharge: 2016-09-19 | Disposition: A | Discharge status: Home / Self Care | Payer: Self-pay | Attending: Emergency Medicine | Admitting: Emergency Medicine

## 2016-09-19 ENCOUNTER — Emergency Department (HOSPITAL_COMMUNITY): Payer: Self-pay

## 2016-09-19 DIAGNOSIS — S8261XA Displaced fracture of lateral malleolus of right fibula, initial encounter for closed fracture: Secondary | ICD-10-CM

## 2016-09-19 DIAGNOSIS — Y929 Unspecified place or not applicable: Secondary | ICD-10-CM | POA: Insufficient documentation

## 2016-09-19 DIAGNOSIS — W109XXA Fall (on) (from) unspecified stairs and steps, initial encounter: Secondary | ICD-10-CM | POA: Insufficient documentation

## 2016-09-19 DIAGNOSIS — S8264XA Nondisplaced fracture of lateral malleolus of right fibula, initial encounter for closed fracture: Secondary | ICD-10-CM | POA: Insufficient documentation

## 2016-09-19 DIAGNOSIS — Y998 Other external cause status: Secondary | ICD-10-CM | POA: Insufficient documentation

## 2016-09-19 DIAGNOSIS — Y9389 Activity, other specified: Secondary | ICD-10-CM | POA: Insufficient documentation

## 2016-09-19 DIAGNOSIS — F17210 Nicotine dependence, cigarettes, uncomplicated: Secondary | ICD-10-CM | POA: Insufficient documentation

## 2016-09-19 DIAGNOSIS — J45909 Unspecified asthma, uncomplicated: Secondary | ICD-10-CM | POA: Insufficient documentation

## 2016-09-19 NOTE — Progress Notes (Signed)
Orthopedic Tech Progress Note Patient Details:  Sierra Long [birth year] [id number]  Ortho Devices Type of Ortho Device: Crutches, CAM walker Ortho Device/Splint Interventions: Application   Saul Fordyce 09/19/2016, 10:33 AM

## 2016-09-19 NOTE — ED Triage Notes (Signed)
Pt here for right ankle injury that occurred this past Saturday, sts that she has been able to ambulate but the pain has worsened. Pt wearing brace currently.

## 2016-09-19 NOTE — Discharge Instructions (Signed)
Please use the crutches for comfort. You can begin to put weight on the right foot after pain allows. Please wear the cam walker when he needs to be up on your feet.  When you are sitting, please elevate the right foot on a pillow at or above the level of your heart. You may apply ice for 15-20 minutes up to 3-4 times a day. 800 mg of ibuprofen may be taken every 8 hours with food or 650 mg of Tylenol every 6 hours for pain control.  Dr. Ophelia Charter is an orthopedic surgeon. You can call his office to schedule a follow-up appointment to ensure your ankle is healing. Typically these types of fractures will be treated with a cam walker for the next 4-6 weeks, weightbearing as tolerated.

## 2016-09-19 NOTE — ED Notes (Signed)
Patient states she was packing to go to the beach on Sat and twisted her ankle though it would get better, states the swelling has gone down however it still hurts.

## 2016-09-19 NOTE — ED Provider Notes (Signed)
[facility]-EMERGENCY DEPT Provider Note   CSN: [id number] Arrival date & time: 09/19/16  0756     History   Chief Complaint Chief Complaint  Patient presents with  . Ankle Pain    HPI Sierra Long is a 26 y.o. female with a history of a right ankle sprain presents to the emergency department with a chief complaint of right ankle pain that began 6 days ago when she lost her footing while walking down a flight of steps in the dark and twisted her right ankle. She reports a previous ankle injury occurred in high school, and she will occasionally roll her ankle while walking, but reports this pain has been constant and more severe. She reports she had an ankle brace at home which she has been wearing since the accident, but has been limping due to the pain. She denies right knee or hip pain. No left ankle pain, bilateral foot pain, numbness, or weakness.   The history is provided by the patient. No language interpreter was used.    Past Medical History:  Diagnosis Date  . Allergy   . Asthma     Patient Active Problem List   Diagnosis Date Noted  . Herniated nucleus pulposus, C6-7 right 05/16/2013    Past Surgical History:  Procedure Laterality Date  . ANTERIOR CERVICAL DECOMP/DISCECTOMY FUSION N/A 05/16/2013   Procedure: Cervical six-seven Anterior cervical decompression/diskectomy/fusion;  Surgeon: Barnett Abu, MD;  Location: [facility];  Service: Neurosurgery;  Laterality: N/A;  Cervical six-seven Anterior cervical decompression/diskectomy/fusion    OB History    Gravida Para Term Preterm AB Living   0 0 0 0 0 0   SAB TAB Ectopic Multiple Live Births   0 0 0 0         Home Medications    Prior to Admission medications   Medication Sig Start Date End Date Taking? Authorizing Provider  aspirin-acetaminophen-caffeine (EXCEDRIN MIGRAINE) [phone] MG tablet Take 1 tablet by mouth every 6 (six) hours as needed for headache. 06/25/15   Wouk, Wilfred Curtis, MD  ibuprofen  (ADVIL,MOTRIN) 200 MG tablet Take 800 mg by mouth every 6 (six) hours as needed for headache or moderate pain.     [provider]    Family History Family History  Problem Relation Age of Onset  . Diabetes Mother   . Hypertension Mother   . Asthma Mother     Social History Social History  Substance Use Topics  . Smoking status: Current Every Day Smoker    Packs/day: 0.50    Years: 8.00    Types: Cigarettes  . Smokeless tobacco: Never Used  . Alcohol use 1.8 oz/week    3 Shots of liquor per week     Allergies   Patient has no known allergies.   Review of Systems Review of Systems  Constitutional: Negative for activity change.  Respiratory: Negative for shortness of breath.   Cardiovascular: Negative for chest pain.  Gastrointestinal: Negative for abdominal pain.  Musculoskeletal: Positive for arthralgias, gait problem, joint swelling and myalgias. Negative for back pain.  Skin: Negative for rash.     Physical Exam Updated Vital Signs BP 97/65   Pulse 69   Temp 97.8 F (36.6 C)   Resp 18   LMP 09/12/2016   SpO2 100%   Physical Exam  Constitutional: No distress.  HENT:  Head: Normocephalic.  Eyes: Conjunctivae are normal.  Neck: Neck supple.  Cardiovascular: Normal rate and regular rhythm.  Exam reveals no gallop  and no friction rub.   No murmur heard. Pulmonary/Chest: Effort normal. No respiratory distress.  Abdominal: Soft. She exhibits no distension.  Musculoskeletal: She exhibits edema and tenderness. She exhibits no deformity.  Point tenderness to palpation over the inferior portion of the right lateral malleolus. Mild edema to the right lateral malleolus. 5 out of 5 strength of bilateral lower extremities. DP and PT pulses are 2+ and symmetric. No overlying abrasions, ecchymosis, erythema, edema or warmth. Able to independently move all toes on the right foot. Normal right knee exam. Antalgic gait. She is able to bear weight on the right foot,  but it is painful.  Neurological: She is alert.  Skin: Skin is warm. No rash noted.  Psychiatric: Her behavior is normal.  Nursing note and vitals reviewed.    ED Treatments / Results  Labs (all labs ordered are listed, but only abnormal results are displayed) Labs Reviewed - No data to display  EKG  EKG Interpretation None       Radiology Dg Ankle Complete Right  Result Date: 09/19/2016 CLINICAL DATA:  Right ankle pain and swelling after twisting injury last week. EXAM: RIGHT ANKLE - COMPLETE 3+ VIEW COMPARISON:  Radiographs of July 25, 2012. FINDINGS: Rounded bone fragment is seen distal to the right fibula most consistent with old fracture. No other fracture or dislocation is noted. Joint spaces are intact. No soft tissue abnormality is noted. IMPRESSION: Rounded bone fragment is seen distal to the fibula most consistent with old fracture. No definite acute abnormality seen in the right ankle. Electronically Signed   By: Lupita Raider, M.D.   On: 09/19/2016 08:27    Procedures Procedures (including critical care time)  Medications Ordered in ED Medications - No data to display   Initial Impression / Assessment and Plan / ED Course  I have reviewed the triage vital signs and the nursing notes.  Pertinent labs & imaging results that were available during my care of the patient were reviewed by me and considered in my medical decision making (see chart for details).     Patient X-Ray with rounded bone fragment distal to the fibula most consistent with an old fracture. The patient has a previous history of a right ankle screening, but no previous ankle fracture. Given his history, we will treat as a lateral malleolus avulsion fracture. Pt advised to follow up with orthopedics for further evaluation and treatment. Patient given cam walker and crutches while in ED, conservative therapy recommended and discussed. Patient will be dc home & is agreeable with above plan. I have  also discussed reasons to return immediately to the ER.  Patient expresses understanding and agrees with plan.    Final Clinical Impressions(s) / ED Diagnoses   Final diagnoses:  Closed avulsion fracture of lateral malleolus of right fibula, initial encounter    New Prescriptions New Prescriptions   No medications on file     Barkley Boards, PA-C 09/19/16 1022    Rolan Bucco, MD 09/19/16 1040

## 2017-07-28 ENCOUNTER — Encounter (HOSPITAL_COMMUNITY): Payer: Self-pay

## 2017-07-28 ENCOUNTER — Ambulatory Visit (HOSPITAL_COMMUNITY)
Admission: EM | Admit: 2017-07-28 | Discharge: 2017-07-28 | Disposition: A | Discharge status: Home / Self Care | Payer: BLUE CROSS/BLUE SHIELD | Attending: Family Medicine | Admitting: Family Medicine

## 2017-07-28 DIAGNOSIS — J069 Acute upper respiratory infection, unspecified: Secondary | ICD-10-CM | POA: Diagnosis not present

## 2017-07-28 DIAGNOSIS — B9789 Other viral agents as the cause of diseases classified elsewhere: Secondary | ICD-10-CM

## 2017-07-28 MED ORDER — ONDANSETRON 4 MG PO TBDP: 4.0000 mg | ORAL_TABLET | Freq: Three times a day (TID) | ORAL | 0 refills | Status: DC | PRN

## 2017-07-28 MED ORDER — PREDNISONE 20 MG PO TABS: 40.0000 mg | ORAL_TABLET | Freq: Every day | ORAL | 0 refills | Status: AC

## 2017-07-28 MED ORDER — AZITHROMYCIN 250 MG PO TABS: 250.0000 mg | ORAL_TABLET | Freq: Every day | ORAL | 0 refills | Status: DC

## 2017-07-28 MED ORDER — IPRATROPIUM BROMIDE 0.06 % NA SOLN: 2.0000 | Freq: Four times a day (QID) | NASAL | 0 refills | Status: DC

## 2017-07-28 NOTE — ED Provider Notes (Signed)
[facility]    CSN: [id number] Arrival date & time: 07/28/17  1520     History   Chief Complaint Chief Complaint  Patient presents with  . Cough    Yellow and green mucous  . Nausea    HPI Sierra Long is a 27 y.o. female.   27 year old female with history of asthma comes in for 6-day history of URI symptoms.  Has had cough, congestion, rhinorrhea, sore throat.  Denies fever, chills, night sweats.  States has had nausea with few episodes of nonbilious nonbloody vomiting, which can be posttussive.  Denies abdominal pain, diarrhea.  Is still been able to eat and drink without difficulty.  States has not needed to use albuterol in many years as she "outgrown" her asthma, but has had times this past week when she felt like she needed due to chest tightness.  She has tried multiple OTC medication without relief, including NyQuil/DayQuil, TheraFlu.  She is a current everyday smoker, 7-pack-year history.     Past Medical History:  Diagnosis Date  . Allergy   . Asthma     Patient Active Problem List   Diagnosis Date Noted  . Herniated nucleus pulposus, C6-7 right 05/16/2013    Past Surgical History:  Procedure Laterality Date  . ANTERIOR CERVICAL DECOMP/DISCECTOMY FUSION N/A 05/16/2013   Procedure: Cervical six-seven Anterior cervical decompression/diskectomy/fusion;  Surgeon: Barnett Abu, MD;  Location: [facility];  Service: Neurosurgery;  Laterality: N/A;  Cervical six-seven Anterior cervical decompression/diskectomy/fusion    OB History    Gravida  0   Para  0   Term  0   Preterm  0   AB  0   Living  0     SAB  0   TAB  0   Ectopic  0   Multiple  0   Live Births               Home Medications    Prior to Admission medications   Medication Sig Start Date End Date Taking? Authorizing Provider  aspirin-acetaminophen-caffeine (EXCEDRIN MIGRAINE) [phone] MG tablet Take 1 tablet by mouth every 6 (six) hours as needed for headache.  06/25/15   Wouk, Wilfred Curtis, MD  azithromycin (ZITHROMAX) 250 MG tablet Take 1 tablet (250 mg total) by mouth daily. Take first 2 tablets together, then 1 every day until finished. Fill 7/27 if not improving 08/01/17   Cathie Hoops, Amy V, PA-C  ibuprofen (ADVIL,MOTRIN) 200 MG tablet Take 800 mg by mouth every 6 (six) hours as needed for headache or moderate pain.     [provider]  ipratropium (ATROVENT) 0.06 % nasal spray Place 2 sprays into both nostrils 4 (four) times daily. 07/28/17   Cathie Hoops, Amy V, PA-C  ondansetron (ZOFRAN ODT) 4 MG disintegrating tablet Take 1 tablet (4 mg total) by mouth every 8 (eight) hours as needed for nausea or vomiting. 07/28/17   Cathie Hoops, Amy V, PA-C  predniSONE (DELTASONE) 20 MG tablet Take 2 tablets (40 mg total) by mouth daily for 5 days. 07/28/17 08/02/17  Belinda Fisher, PA-C    Family History Family History  Problem Relation Age of Onset  . Diabetes Mother   . Hypertension Mother   . Asthma Mother     Social History Social History   Tobacco Use  . Smoking status: Current Every Day Smoker    Packs/day: 0.50    Years: 8.00    Pack years: 4.00    Types: Cigarettes  .  Smokeless tobacco: Never Used  Substance Use Topics  . Alcohol use: Yes    Alcohol/week: 1.8 oz    Types: 3 Shots of liquor per week  . Drug use: No     Allergies   Patient has no known allergies.   Review of Systems Review of Systems  Reason unable to perform ROS: See HPI as above.    Physical Exam Triage Vital Signs ED Triage Vitals [07/28/17 1537]  Enc Vitals Group     BP 116/71     Pulse Rate 71     Resp 19     Temp 98.3 F (36.8 C)     Temp Source Oral     SpO2 100 %     Weight      Height      Head Circumference      Peak Flow      Pain Score      Pain Loc      Pain Edu?      Excl. in GC?    No data found.  Updated Vital Signs BP 116/71 (BP Location: Left Arm)   Pulse 71   Temp 98.3 F (36.8 C) (Oral)   Resp 19   LMP 06/28/2017   SpO2 100%    Physical  Exam  Constitutional: She is oriented to person, place, and time. She appears well-developed and well-nourished. No distress.  HENT:  Head: Normocephalic and atraumatic.  Right Ear: Tympanic membrane, external ear and ear canal normal. Tympanic membrane is not erythematous and not bulging.  Left Ear: Tympanic membrane, external ear and ear canal normal. Tympanic membrane is not erythematous and not bulging.  Nose: Nose normal. Right sinus exhibits no maxillary sinus tenderness and no frontal sinus tenderness. Left sinus exhibits no maxillary sinus tenderness and no frontal sinus tenderness.  Mouth/Throat: Uvula is midline, oropharynx is clear and moist and mucous membranes are normal.  Eyes: Pupils are equal, round, and reactive to light. Conjunctivae are normal.  Neck: Normal range of motion. Neck supple.  Cardiovascular: Normal rate, regular rhythm and normal heart sounds. Exam reveals no gallop and no friction rub.  No murmur heard. Pulmonary/Chest: Effort normal and breath sounds normal. No stridor. No respiratory distress. She has no decreased breath sounds. She has no wheezes. She has no rhonchi. She has no rales. She exhibits tenderness (bilateral).  Lymphadenopathy:    She has no cervical adenopathy.  Neurological: She is alert and oriented to person, place, and time.  Skin: Skin is warm and dry. She is not diaphoretic.  Psychiatric: She has a normal mood and affect. Her behavior is normal. Judgment normal.   UC Treatments / Results  Labs (all labs ordered are listed, but only abnormal results are displayed) Labs Reviewed - No data to display  EKG None  Radiology No results found.  Procedures Procedures (including critical care time)  Medications Ordered in UC Medications - No data to display  Initial Impression / Assessment and Plan / UC Course  I have reviewed the triage vital signs and the nursing notes.  Pertinent labs & imaging results that were available during my  care of the patient were reviewed by me and considered in my medical decision making (see chart for details).    Discussed with patient history and exam most consistent with viral URI.  Given smoking history, and history of asthma, will provide prednisone for symptoms.  Zofran as needed for nausea, vomiting.  Symptomatic treatment as needed. Push fluids.  Rx of azithromycin sent to pharmacy, can fill in 4 days if symptoms do not improving.  Return precautions given.  Patient expresses understanding and agrees with plan.  Final Clinical Impressions(s) / UC Diagnoses   Final diagnoses:  Viral URI with cough    ED Prescriptions    Medication Sig Dispense Auth. Provider   predniSONE (DELTASONE) 20 MG tablet Take 2 tablets (40 mg total) by mouth daily for 5 days. 10 tablet Yu, Amy V, PA-C   ipratropium (ATROVENT) 0.06 % nasal spray Place 2 sprays into both nostrils 4 (four) times daily. 15 mL Yu, Amy V, PA-C   azithromycin (ZITHROMAX) 250 MG tablet Take 1 tablet (250 mg total) by mouth daily. Take first 2 tablets together, then 1 every day until finished. Fill 7/27 if not improving 6 tablet Yu, Amy V, PA-C   ondansetron (ZOFRAN ODT) 4 MG disintegrating tablet Take 1 tablet (4 mg total) by mouth every 8 (eight) hours as needed for nausea or vomiting. 10 tablet Threasa AlphaYu, Amy V, PA-C        Yu, Amy V, [place] 07/28/17 1629

## 2017-07-28 NOTE — Discharge Instructions (Addendum)
Prednisone for cough and possible bronchitis. Zofran as needed for nausea/vomiting. Start atrovent nasal spray for nasal congestion/drainage. You can use over the counter nasal saline rinse such as neti pot for nasal congestion. Keep hydrated, your urine should be clear to pale yellow in color. Tylenol/motrin for fever and pain. Monitor for any worsening of symptoms, chest pain, shortness of breath, wheezing, swelling of the throat, follow up for reevaluation.   Prescription of azithromycin called into pharmacy, it will be filled on 7/27, you can take if symptoms still not improving.  For sore throat/cough try using a honey-based tea. Use 3 teaspoons of honey with juice squeezed from half lemon. Place shaved pieces of ginger into 1/2-1 cup of water and warm over stove top. Then mix the ingredients and repeat every 4 hours as needed.

## 2017-07-28 NOTE — ED Triage Notes (Signed)
Pt presents with cough (yellow and green) mucous and nausea and vomiting

## 2019-05-11 IMAGING — DX DG ANKLE COMPLETE 3+V*R*
3 series · 3 of 3 positions shown · non-contrast
Comparison: Radiographs July 25, 2012.

CLINICAL DATA: Right ankle pain and swelling after twisting injury
last week.

EXAM:
RIGHT ANKLE - COMPLETE 3+ VIEW

[x ankle ap right]
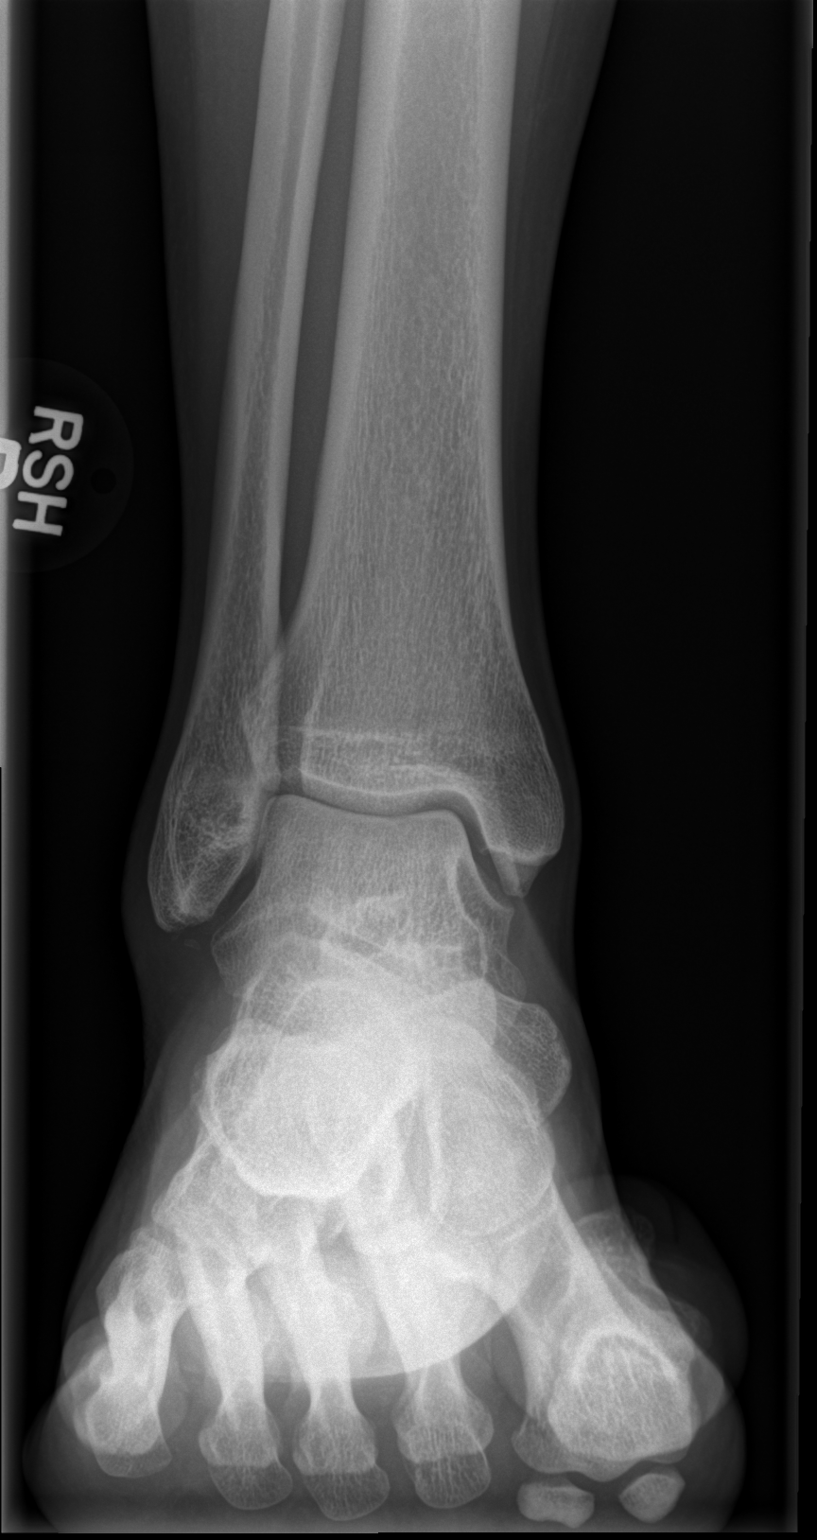

[x ankle obl right]
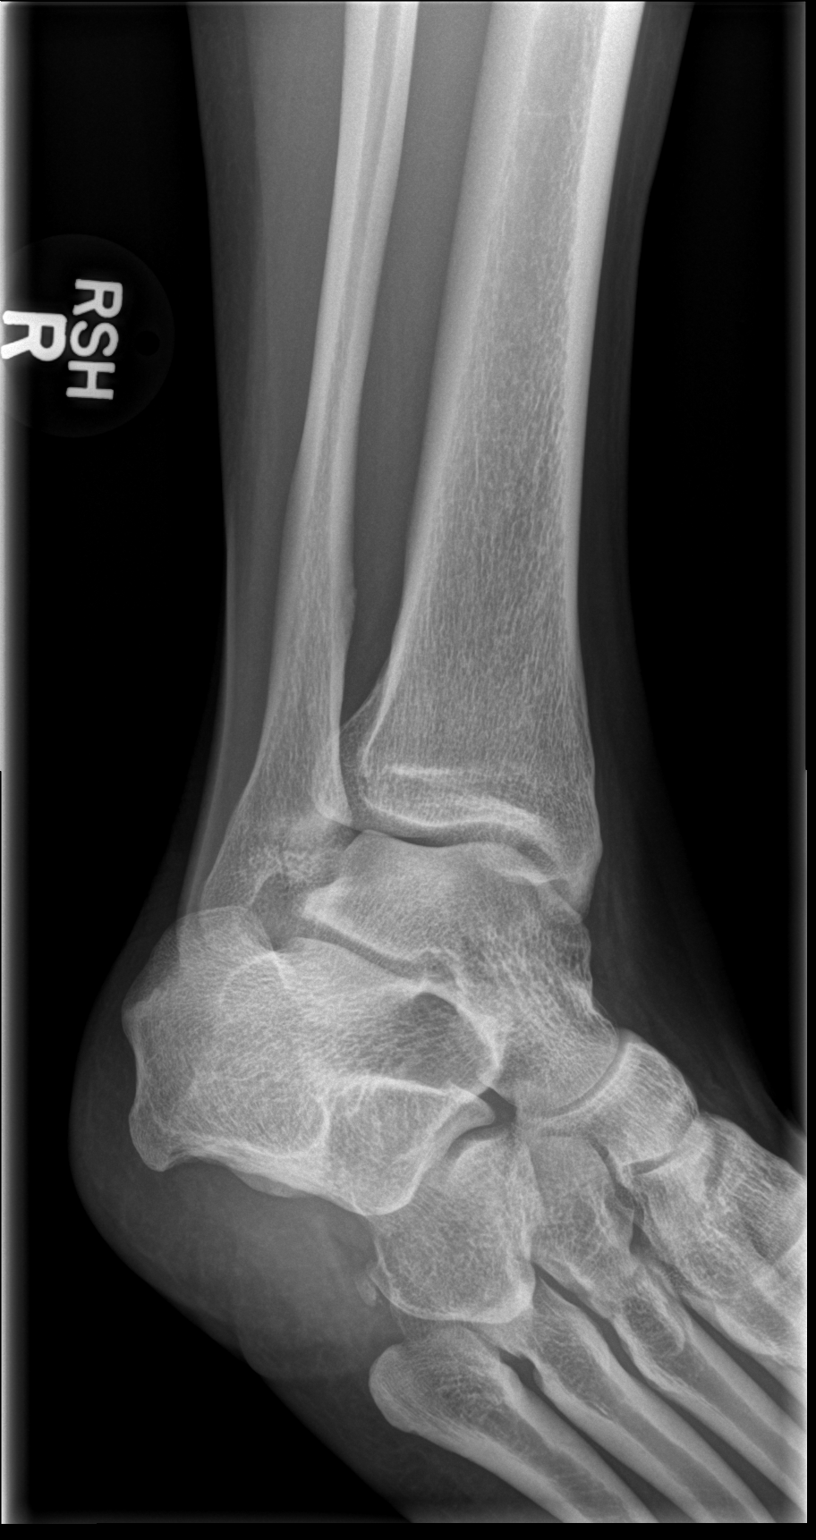

[x ankle lat right]
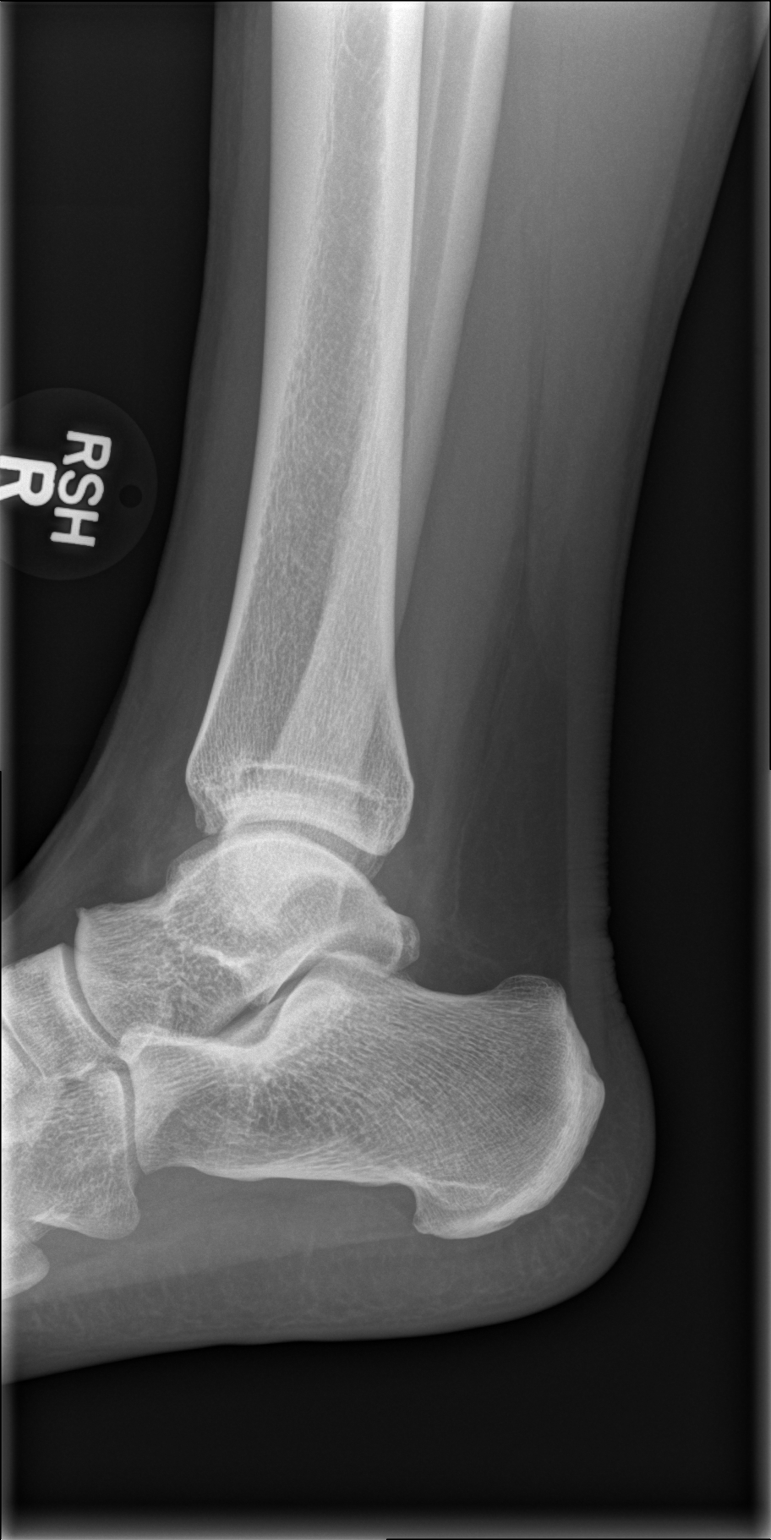

[3 of 3 positions shown; findings below may reference images not displayed]

FINDINGS: Rounded bone fragment is seen distal to the right fibula most
consistent with old fracture. No other fracture or dislocation is
noted. Joint spaces are intact. No soft tissue abnormality is noted.
IMPRESSION: Rounded bone fragment is seen distal to the fibula most consistent
with old fracture. No definite acute abnormality seen in the right
ankle.

## 2021-04-01 DIAGNOSIS — Z6824 Body mass index (BMI) 24.0-24.9, adult: Secondary | ICD-10-CM | POA: Diagnosis not present

## 2021-04-01 DIAGNOSIS — Z01419 Encounter for gynecological examination (general) (routine) without abnormal findings: Secondary | ICD-10-CM | POA: Diagnosis not present

## 2021-04-01 DIAGNOSIS — Z124 Encounter for screening for malignant neoplasm of cervix: Secondary | ICD-10-CM | POA: Diagnosis not present

## 2022-07-27 DIAGNOSIS — S63502A Unspecified sprain of left wrist, initial encounter: Secondary | ICD-10-CM | POA: Diagnosis not present

## 2022-09-22 DIAGNOSIS — Z01419 Encounter for gynecological examination (general) (routine) without abnormal findings: Secondary | ICD-10-CM | POA: Diagnosis not present

## 2022-10-09 ENCOUNTER — Encounter: Payer: Self-pay | Admitting: Emergency Medicine

## 2022-10-09 ENCOUNTER — Other Ambulatory Visit: Payer: Self-pay

## 2022-10-09 ENCOUNTER — Ambulatory Visit
Admission: EM | Admit: 2022-10-09 | Discharge: 2022-10-09 | Disposition: A | Discharge status: Home / Self Care | Payer: No Typology Code available for payment source | Attending: Internal Medicine | Admitting: Internal Medicine

## 2022-10-09 DIAGNOSIS — J309 Allergic rhinitis, unspecified: Secondary | ICD-10-CM | POA: Diagnosis not present

## 2022-10-09 DIAGNOSIS — B349 Viral infection, unspecified: Secondary | ICD-10-CM

## 2022-10-09 DIAGNOSIS — R0602 Shortness of breath: Secondary | ICD-10-CM | POA: Diagnosis present

## 2022-10-09 DIAGNOSIS — Z1152 Encounter for screening for COVID-19: Secondary | ICD-10-CM | POA: Diagnosis not present

## 2022-10-09 DIAGNOSIS — F17290 Nicotine dependence, other tobacco product, uncomplicated: Secondary | ICD-10-CM | POA: Insufficient documentation

## 2022-10-09 DIAGNOSIS — R059 Cough, unspecified: Secondary | ICD-10-CM | POA: Diagnosis present

## 2022-10-09 DIAGNOSIS — J4530 Mild persistent asthma, uncomplicated: Secondary | ICD-10-CM

## 2022-10-09 DIAGNOSIS — R062 Wheezing: Secondary | ICD-10-CM | POA: Diagnosis present

## 2022-10-09 MED ORDER — LEVALBUTEROL TARTRATE 45 MCG/ACT IN AERO: 1.0000 | INHALATION_SPRAY | Freq: Four times a day (QID) | RESPIRATORY_TRACT | 0 refills | Status: AC | PRN

## 2022-10-09 MED ORDER — PROMETHAZINE-DM 6.25-15 MG/5ML PO SYRP: 5.0000 mL | ORAL_SOLUTION | Freq: Three times a day (TID) | ORAL | 0 refills | Status: AC | PRN

## 2022-10-09 MED ORDER — CETIRIZINE HCL 10 MG PO TABS: 10.0000 mg | ORAL_TABLET | Freq: Every day | ORAL | 0 refills | Status: AC

## 2022-10-09 MED ORDER — PREDNISONE 20 MG PO TABS: ORAL_TABLET | ORAL | 0 refills | Status: AC

## 2022-10-09 NOTE — ED Provider Notes (Signed)
[facility]  Note:  This document was prepared using Conservation officer, historic buildings and may include unintentional dictation errors.  MRN: [id number] DOB: [birth year]  Subjective:   Sierra Long is a 32 y.o. female presenting for 2-day history of acute onset runny and stuffy nose, shortness of breath, chest tightness and wheezing, persistent coughing.  Patient has a history of allergies and asthma.  Does not have an active inhaler, has not needed one for years.  Patient currently vapes and smokes marijuana daily.  Reports that she regularly gets bronchitis.  Would like a COVID test.  No current facility-administered medications for this encounter.  Current Outpatient Medications:    aspirin-acetaminophen-caffeine (EXCEDRIN MIGRAINE) 250-250-65 MG tablet, Take 1 tablet by mouth every 6 (six) hours as needed for headache., Disp: 30 tablet, Rfl: 1   No Known Allergies  Past Medical History:  Diagnosis Date   Allergy    Asthma      Past Surgical History:  Procedure Laterality Date   ANTERIOR CERVICAL DECOMP/DISCECTOMY FUSION N/A 05/16/2013   Procedure: Cervical six-seven Anterior cervical decompression/diskectomy/fusion;  Surgeon: Barnett Abu, MD;  Location: [facility];  Service: Neurosurgery;  Laterality: N/A;  Cervical six-seven Anterior cervical decompression/diskectomy/fusion    Family History  Problem Relation Age of Onset   Diabetes Mother    Hypertension Mother    Asthma Mother     Social History   Tobacco Use   Smoking status: Former    Current packs/day: 0.50    Average packs/day: 0.5 packs/day for 8.0 years (4.0 ttl pk-yrs)    Types: Cigarettes   Smokeless tobacco: Never  Vaping Use   Vaping status: Every Day  Substance Use Topics   Alcohol use: Not Currently    Alcohol/week: 3.0 standard drinks of alcohol    Types: 3 Shots of liquor per week   Drug use: Yes    Types: Marijuana    ROS   Objective:   Vitals: BP 105/69  (BP Location: Right Arm)   Pulse 72   Temp 97.9 F (36.6 C) (Oral)   Resp 18   LMP 10/06/2022   SpO2 97%   Physical Exam Constitutional:      General: She is not in acute distress.    Appearance: Normal appearance. She is well-developed and normal weight. She is not ill-appearing, toxic-appearing or diaphoretic.  HENT:     Head: Normocephalic and atraumatic.     Right Ear: Tympanic membrane, ear canal and external ear normal. No drainage or tenderness. No middle ear effusion. There is no impacted cerumen. Tympanic membrane is not erythematous or bulging.     Left Ear: Tympanic membrane, ear canal and external ear normal. No drainage or tenderness.  No middle ear effusion. There is no impacted cerumen. Tympanic membrane is not erythematous or bulging.     Nose: Nose normal. No congestion or rhinorrhea.     Mouth/Throat:     Mouth: Mucous membranes are moist. No oral lesions.     Pharynx: No pharyngeal swelling, oropharyngeal exudate, posterior oropharyngeal erythema or uvula swelling.     Tonsils: No tonsillar exudate or tonsillar abscesses.  Eyes:     General: No scleral icterus.       Right eye: No discharge.        Left eye: No discharge.     Extraocular Movements: Extraocular movements intact.     Right eye: Normal extraocular motion.     Left eye: Normal extraocular motion.  Conjunctiva/sclera: Conjunctivae normal.  Cardiovascular:     Rate and Rhythm: Normal rate and regular rhythm.     Heart sounds: Normal heart sounds. No murmur heard.    No friction rub. No gallop.  Pulmonary:     Effort: Pulmonary effort is normal. No respiratory distress.     Breath sounds: No stridor. No wheezing, rhonchi or rales.  Chest:     Chest wall: No tenderness.  Musculoskeletal:     Cervical back: Normal range of motion and neck supple.  Lymphadenopathy:     Cervical: No cervical adenopathy.  Skin:    General: Skin is warm and dry.  Neurological:     General: No focal deficit  present.     Mental Status: She is alert and oriented to person, place, and time.  Psychiatric:        Mood and Affect: Mood normal.        Behavior: Behavior normal.     Assessment and Plan :   PDMP not reviewed this encounter.  1. Acute viral syndrome   2. Allergic rhinitis, unspecified seasonality, unspecified trigger   3. Mild persistent asthma without complication    Suspect a mild acute on chronic exacerbation of her asthma secondary to a viral syndrome and likely worsened from her smoking.  Recommended an oral prednisone course.  Refilled her albuterol.  COVID testing pending.  Would recommend COVID antivirals given her risk factors.  Will defer imaging for now.  Counseled patient on potential for adverse effects with medications prescribed/recommended today, ER and return-to-clinic precautions discussed, patient verbalized understanding.    Wallis Bamberg, [place] 10/09/22 [phone]

## 2022-10-09 NOTE — ED Notes (Signed)
Reviewed multiple work notes for patient and significant other

## 2022-10-09 NOTE — Discharge Instructions (Addendum)
We will notify you of your test results as they arrive and may take between about 24 hours.  I encourage you to sign up for MyChart if you have not already done so as this can be the easiest way for [place] to communicate results to you online or through a phone app.  Generally, we only contact you if it is a positive test result.  In the meantime, if you develop worsening symptoms including fever, chest pain, shortness of breath despite our current treatment plan then please report to the emergency room as this may be a sign of worsening status from possible viral infection.  Otherwise, we will manage this as a viral syndrome. For sore throat or cough try using a honey-based tea. Use 3 teaspoons of honey with juice squeezed from half lemon. Place shaved pieces of ginger into 1/2-1 cup of water and warm over stove top. Then mix the ingredients and repeat every 4 hours as needed. Please take Tylenol 650mg  every 6 hours for aches and pains, fevers. Hydrate very well with at least 2 liters of water. Eat light meals such as soups to replenish electrolytes and soft fruits, veggies. Start an antihistamine like Zyrtec (10mg  daily) for postnasal drainage, sinus congestion.  You can take this together with prednisone and albuterol.  Use the cough medications as needed.

## 2022-10-09 NOTE — ED Triage Notes (Signed)
2 days ago, runny nose, then yesterday blew nose and secretions yellow.  Patient started coughing today, reports as a dry cough.  Patient has taken sudafed and allergy medicine

## 2022-10-10 LAB — SARS CORONAVIRUS 2 (TAT 6-24 HRS): SARS Coronavirus 2: NEGATIVE

## 2023-10-14 DIAGNOSIS — Z01419 Encounter for gynecological examination (general) (routine) without abnormal findings: Secondary | ICD-10-CM | POA: Diagnosis not present
